# Patient Record
Sex: Male | Born: 1945 | Race: White | Hispanic: No | Marital: Married | Smoking: Former smoker
Health system: Southern US, Community
[De-identification: ages and names within clinical notes are randomized; demographics above are authoritative.]

## PROBLEM LIST (undated history)

## (undated) DIAGNOSIS — F419 Anxiety disorder, unspecified: Secondary | ICD-10-CM

## (undated) DIAGNOSIS — M1990 Unspecified osteoarthritis, unspecified site: Secondary | ICD-10-CM

## (undated) DIAGNOSIS — I10 Essential (primary) hypertension: Secondary | ICD-10-CM

## (undated) DIAGNOSIS — E7800 Pure hypercholesterolemia, unspecified: Secondary | ICD-10-CM

## (undated) DIAGNOSIS — J189 Pneumonia, unspecified organism: Secondary | ICD-10-CM

## (undated) HISTORY — PX: BUNIONECTOMY: SHX129

---

## 2015-05-04 ENCOUNTER — Encounter (HOSPITAL_COMMUNITY): Payer: Self-pay

## 2015-05-04 ENCOUNTER — Emergency Department (HOSPITAL_COMMUNITY)
Admission: EM | Admit: 2015-05-04 | Discharge: 2015-05-04 | Disposition: A | Discharge status: Home / Self Care | Payer: BLUE CROSS/BLUE SHIELD | Attending: Emergency Medicine | Admitting: Emergency Medicine

## 2015-05-04 DIAGNOSIS — I10 Essential (primary) hypertension: Secondary | ICD-10-CM | POA: Diagnosis not present

## 2015-05-04 DIAGNOSIS — Z87891 Personal history of nicotine dependence: Secondary | ICD-10-CM | POA: Insufficient documentation

## 2015-05-04 DIAGNOSIS — F419 Anxiety disorder, unspecified: Secondary | ICD-10-CM | POA: Diagnosis not present

## 2015-05-04 DIAGNOSIS — R42 Dizziness and giddiness: Secondary | ICD-10-CM | POA: Diagnosis present

## 2015-05-04 DIAGNOSIS — Z79899 Other long term (current) drug therapy: Secondary | ICD-10-CM | POA: Diagnosis not present

## 2015-05-04 DIAGNOSIS — E7800 Pure hypercholesterolemia, unspecified: Secondary | ICD-10-CM | POA: Diagnosis not present

## 2015-05-04 HISTORY — DX: Essential (primary) hypertension: I10

## 2015-05-04 HISTORY — DX: Anxiety disorder, unspecified: F41.9

## 2015-05-04 HISTORY — DX: Pure hypercholesterolemia, unspecified: E78.00

## 2015-05-04 LAB — URINALYSIS, ROUTINE W REFLEX MICROSCOPIC
BILIRUBIN URINE: NEGATIVE
GLUCOSE, UA: NEGATIVE mg/dL
Hgb urine dipstick: NEGATIVE
KETONES UR: NEGATIVE mg/dL
LEUKOCYTES UA: NEGATIVE
NITRITE: NEGATIVE
PH: 7 (ref 5.0–8.0)
PROTEIN: NEGATIVE mg/dL
Specific Gravity, Urine: 1.026 (ref 1.005–1.030)

## 2015-05-04 LAB — BASIC METABOLIC PANEL
Anion gap: 11 (ref 5–15)
BUN: 20 mg/dL (ref 6–20)
CO2: 23 mmol/L (ref 22–32)
CREATININE: 0.93 mg/dL (ref 0.61–1.24)
Calcium: 9.5 mg/dL (ref 8.9–10.3)
Chloride: 107 mmol/L (ref 101–111)
GFR calc Af Amer: >60 mL/min (ref >60)
GLUCOSE: 120 mg/dL — AB (ref 65–99)
Potassium: 4.3 mmol/L (ref 3.5–5.1)
SODIUM: 141 mmol/L (ref 135–145)

## 2015-05-04 LAB — CBC
HCT: 43.9 % (ref 39.0–52.0)
Hemoglobin: 15.1 g/dL (ref 13.0–17.0)
MCH: 32.7 pg (ref 26.0–34.0)
MCHC: 34.4 g/dL (ref 30.0–36.0)
MCV: 95 fL (ref 78.0–100.0)
PLATELETS: 168 10*3/uL (ref 150–400)
RBC: 4.62 MIL/uL (ref 4.22–5.81)
RDW: 12.2 % (ref 11.5–15.5)
WBC: 8 10*3/uL (ref 4.0–10.5)

## 2015-05-04 LAB — CBG MONITORING, ED: Glucose-Capillary: 122 mg/dL — ABNORMAL HIGH (ref 65–99)

## 2015-05-04 MED ORDER — DIAZEPAM 5 MG PO TABS: 5.0000 mg | ORAL_TABLET | Freq: Three times a day (TID) | ORAL | Status: AC | PRN

## 2015-05-04 MED ORDER — SODIUM CHLORIDE 0.9 % IV BOLUS (SEPSIS)
1000.0000 mL | Freq: Once | INTRAVENOUS | Status: AC
  Administered 2015-05-04: 1000 mL via INTRAVENOUS

## 2015-05-04 MED ORDER — MECLIZINE HCL 25 MG PO TABS: 25.0000 mg | ORAL_TABLET | Freq: Three times a day (TID) | ORAL | Status: AC | PRN

## 2015-05-04 MED ORDER — MECLIZINE HCL 25 MG PO TABS
25.0000 mg | ORAL_TABLET | Freq: Once | ORAL | Status: AC
  Administered 2015-05-04: 25 mg via ORAL
  Filled 2015-05-04: qty 1

## 2015-05-04 MED ORDER — ONDANSETRON HCL 4 MG/2ML IJ SOLN
4.0000 mg | Freq: Once | INTRAMUSCULAR | Status: AC
  Administered 2015-05-04: 4 mg via INTRAVENOUS
  Filled 2015-05-04: qty 2

## 2015-05-04 MED ORDER — LORAZEPAM 2 MG/ML IJ SOLN
1.0000 mg | Freq: Once | INTRAMUSCULAR | Status: AC
  Administered 2015-05-04: 1 mg via INTRAVENOUS
  Filled 2015-05-04: qty 1

## 2015-05-04 NOTE — ED Notes (Signed)
Per pt, was at work today.  Felt dizzy, nauseated and diaphoretic.  Sudden onset.  Has not stopped.  Denies chest pain.  No fever.  No vomiting.

## 2015-05-04 NOTE — ED Provider Notes (Signed)
CSN: [id number]     Arrival date & time 05/04/15  1115 History   First MD Initiated Contact with Patient 05/04/15 1136     Chief Complaint  Patient presents with  . Dizziness  . Nausea      HPI  Patient presents to the emergency department after complaints of acute sensation of movement and spinning.  He states his symptoms are worse with rapid movements of his head.  He denies weakness of his arms or legs.  He denies disequilibrium.  No prior history of vertigo.  Denies injury or trauma.  No headache.  No neck pain.  Denies weakness of his arms or legs.  He was at work when the symptoms began.  No chest pain shortness of breath.  Symptoms are moderate in severity.  Past Medical History  Diagnosis Date  . Hypertension   . Anxiety   . Hypercholesteremia    Past Surgical History  Procedure Laterality Date  . Bunionectomy     History reviewed. No pertinent family history. Social History  Substance Use Topics  . Smoking status: Former Games developer  . Smokeless tobacco: None  . Alcohol Use: Yes     Comment: social    Review of Systems  All other systems reviewed and are negative.     Allergies  Review of patient's allergies indicates no known allergies.  Home Medications   Prior to Admission medications   Medication Sig Start Date End Date Taking? Authorizing Provider  aspirin-acetaminophen-caffeine (EXCEDRIN MIGRAINE) [phone] MG tablet Take 1 tablet by mouth daily as needed for headache.   Yes Historical Provider, MD  chlorhexidine (PERIDEX) 0.12 % solution by Mouth Rinse route. Swish with 1/2 a capful before bedtime and spit starting 24 hours after surgery 04/15/15  Yes Historical Provider, MD  Glucos-Chondroit-Hyaluron-MSM (GLUCOSAMINE CHONDROITIN JOINT) TABS Take 2 tablets by mouth daily.   Yes Historical Provider, MD  ibuprofen (ADVIL,MOTRIN) 800 MG tablet Take 800 mg by mouth every 6 (six) hours as needed for moderate pain.  04/15/15  Yes Historical Provider, MD  losartan  (COZAAR) 50 MG tablet Take 50 mg by mouth daily. 04/11/15  Yes Historical Provider, MD  naproxen sodium (ANAPROX) 220 MG tablet Take 220 mg by mouth daily as needed (pain).   Yes Historical Provider, MD  Omega-3 Fatty Acids (OMEGA 3 500) 500 MG CAPS Take 1,000 mg by mouth daily.   Yes Historical Provider, MD  PARoxetine (PAXIL) 20 MG tablet Take 20 mg by mouth daily. 04/11/15  Yes Historical Provider, MD  simvastatin (ZOCOR) 10 MG tablet Take 10 mg by mouth daily. 04/11/15  Yes Historical Provider, MD                 BP 124/80 mmHg  Pulse 78  Resp 18  SpO2 95% Physical Exam  Constitutional: He is oriented to person, place, and time. He appears well-developed and well-nourished.  HENT:  Head: Normocephalic and atraumatic.  Eyes: EOM are normal. Pupils are equal, round, and reactive to light.  Neck: Normal range of motion.  Cardiovascular: Normal rate, regular rhythm, normal heart sounds and intact distal pulses.   Pulmonary/Chest: Effort normal and breath sounds normal. No respiratory distress.  Abdominal: Soft. He exhibits no distension. There is no tenderness.  Musculoskeletal: Normal range of motion.  Neurological: He is alert and oriented to person, place, and time.  5/5 strength in major muscle groups of  bilateral upper and lower extremities. Speech normal. No facial asymetry.   Skin: Skin is warm  and dry.  Psychiatric: He has a normal mood and affect. Judgment normal.  Nursing note and vitals reviewed.   ED Course  Procedures (including critical care time) Labs Review Labs Reviewed  BASIC METABOLIC PANEL - Abnormal; Notable for the following:    Glucose, Bld 120 (*)    All other components within normal limits  CBG MONITORING, ED - Abnormal; Notable for the following:    Glucose-Capillary 122 (*)    All other components within normal limits  CBC  URINALYSIS, ROUTINE W REFLEX MICROSCOPIC (NOT AT [facility])    Imaging Review No results found. I have personally reviewed and  evaluated these images and lab results as part of my medical decision-making.   EKG Interpretation   Date/Time:  Tuesday May 04 2015 11:26:30 EST Ventricular Rate:  76 PR Interval:  153 QRS Duration: 93 QT Interval:  390 QTC Calculation: 438 R Axis:   64 Text Interpretation:  Sinus rhythm No old tracing to compare Confirmed by  Augusta Hilbert  MD, Caryn Bee ([id number]) on 05/04/2015 11:36:58 AM      MDM   Final diagnoses:  Vertigo    Patient feels much better after Ativan and Valium in the emergency department.  He is able to stand and sit without any difficulty.  He is able to rapidly move his head.  His neurologic exam remains normal.  Doubt central vertigo.  Discharge home with both Valium and meclizine.    Azalia Bilis, MD 05/04/15 [phone]

## 2015-05-04 NOTE — ED Notes (Signed)
Patient aware we need urine, I set a urinal at bedside, patient will let us know when he has a specimen ready.

## 2015-05-04 NOTE — Discharge Instructions (Signed)
Benign Positional Vertigo Vertigo is the feeling that you or your surroundings are moving when they are not. Benign positional vertigo is the most common form of vertigo. The cause of this condition is not serious (is benign). This condition is triggered by certain movements and positions (is positional). This condition can be dangerous if it occurs while you are doing something that could endanger you or others, such as driving.  CAUSES In many cases, the cause of this condition is not known. It may be caused by a disturbance in an area of the inner ear that helps your brain to sense movement and balance. This disturbance can be caused by a viral infection (labyrinthitis), head injury, or repetitive motion. RISK FACTORS This condition is more likely to develop in:  Women.  People who are 50 years of age or older. SYMPTOMS Symptoms of this condition usually happen when you move your head or your eyes in different directions. Symptoms may start suddenly, and they usually last for less than a minute. Symptoms may include:  Loss of balance and falling.  Feeling like you are spinning or moving.  Feeling like your surroundings are spinning or moving.  Nausea and vomiting.  Blurred vision.  Dizziness.  Involuntary eye movement (nystagmus). Symptoms can be mild and cause only slight annoyance, or they can be severe and interfere with daily life. Episodes of benign positional vertigo may return (recur) over time, and they may be triggered by certain movements. Symptoms may improve over time. DIAGNOSIS This condition is usually diagnosed by medical history and a physical exam of the head, neck, and ears. You may be referred to a health care provider who specializes in ear, nose, and throat (ENT) problems (otolaryngologist) or a provider who specializes in disorders of the nervous system (neurologist). You may have additional testing, including:  MRI.  A CT scan.  Eye movement tests. Your  health care provider may ask you to change positions quickly while he or she watches you for symptoms of benign positional vertigo, such as nystagmus. Eye movement may be tested with an electronystagmogram (ENG), caloric stimulation, the Dix-Hallpike test, or the roll test.  An electroencephalogram (EEG). This records electrical activity in your brain.  Hearing tests. TREATMENT Usually, your health care provider will treat this by moving your head in specific positions to adjust your inner ear back to normal. Surgery may be needed in severe cases, but this is rare. In some cases, benign positional vertigo may resolve on its own in 2-4 weeks. HOME CARE INSTRUCTIONS Safety  Move slowly.Avoid sudden body or head movements.  Avoid driving.  Avoid operating heavy machinery.  Avoid doing any tasks that would be dangerous to you or others if a vertigo episode would occur.  If you have trouble walking or keeping your balance, try using a cane for stability. If you feel dizzy or unstable, sit down right away.  Return to your normal activities as told by your health care provider. Ask your health care provider what activities are safe for you. General Instructions  Take over-the-counter and prescription medicines only as told by your health care provider.  Avoid certain positions or movements as told by your health care provider.  Drink enough fluid to keep your urine clear or pale yellow.  Keep all follow-up visits as told by your health care provider. This is important. SEEK MEDICAL CARE IF:  You have a fever.  Your condition gets worse or you develop new symptoms.  Your family or friends   notice any behavioral changes.  Your nausea or vomiting gets worse.  You have numbness or a "pins and needles" sensation. SEEK IMMEDIATE MEDICAL CARE IF:  You have difficulty speaking or moving.  You are always dizzy.  You faint.  You develop severe headaches.  You have weakness in your  legs or arms.  You have changes in your hearing or vision.  You develop a stiff neck.  You develop sensitivity to light.   This information is not intended to replace advice given to you by your health care provider. Make sure you discuss any questions you have with your health care provider.   Document Released: 12/19/2005 Document Revised: 12/02/2014 Document Reviewed: 07/06/2014 Elsevier Interactive Patient Education 2016 [employer]  

## 2018-07-05 ENCOUNTER — Emergency Department (HOSPITAL_BASED_OUTPATIENT_CLINIC_OR_DEPARTMENT_OTHER)
Admission: EM | Admit: 2018-07-05 | Discharge: 2018-07-06 | Disposition: A | Discharge status: Home / Self Care | Payer: 59 | Attending: Emergency Medicine | Admitting: Emergency Medicine

## 2018-07-05 ENCOUNTER — Emergency Department (HOSPITAL_BASED_OUTPATIENT_CLINIC_OR_DEPARTMENT_OTHER): Payer: 59

## 2018-07-05 ENCOUNTER — Other Ambulatory Visit: Payer: Self-pay

## 2018-07-05 ENCOUNTER — Encounter (HOSPITAL_BASED_OUTPATIENT_CLINIC_OR_DEPARTMENT_OTHER): Payer: Self-pay | Admitting: *Deleted

## 2018-07-05 DIAGNOSIS — J189 Pneumonia, unspecified organism: Secondary | ICD-10-CM | POA: Insufficient documentation

## 2018-07-05 DIAGNOSIS — Z79899 Other long term (current) drug therapy: Secondary | ICD-10-CM | POA: Diagnosis not present

## 2018-07-05 DIAGNOSIS — I10 Essential (primary) hypertension: Secondary | ICD-10-CM | POA: Diagnosis not present

## 2018-07-05 DIAGNOSIS — J181 Lobar pneumonia, unspecified organism: Secondary | ICD-10-CM

## 2018-07-05 DIAGNOSIS — R509 Fever, unspecified: Secondary | ICD-10-CM | POA: Diagnosis present

## 2018-07-05 DIAGNOSIS — Z87891 Personal history of nicotine dependence: Secondary | ICD-10-CM | POA: Diagnosis not present

## 2018-07-05 LAB — CBC WITH DIFFERENTIAL/PLATELET
Abs Immature Granulocytes: 0.01 10*3/uL (ref 0.00–0.07)
Basophils Absolute: 0 10*3/uL (ref 0.0–0.1)
Basophils Relative: 0 %
Eosinophils Absolute: 0 10*3/uL (ref 0.0–0.5)
Eosinophils Relative: 0 %
HCT: 40.8 % (ref 39.0–52.0)
Hemoglobin: 13.7 g/dL (ref 13.0–17.0)
Immature Granulocytes: 0 %
Lymphocytes Relative: 17 %
Lymphs Abs: 0.7 10*3/uL (ref 0.7–4.0)
MCH: 32 pg (ref 26.0–34.0)
MCHC: 33.6 g/dL (ref 30.0–36.0)
MCV: 95.3 fL (ref 80.0–100.0)
Monocytes Absolute: 0.3 10*3/uL (ref 0.1–1.0)
Monocytes Relative: 9 %
Neutro Abs: 2.9 10*3/uL (ref 1.7–7.7)
Neutrophils Relative %: 74 %
Platelets: 150 10*3/uL (ref 150–400)
RBC: 4.28 MIL/uL (ref 4.22–5.81)
RDW: 12 % (ref 11.5–15.5)
WBC: 3.9 10*3/uL — ABNORMAL LOW (ref 4.0–10.5)
nRBC: 0 % (ref 0.0–0.2)

## 2018-07-05 LAB — URINALYSIS, ROUTINE W REFLEX MICROSCOPIC
Bilirubin Urine: NEGATIVE
Glucose, UA: NEGATIVE mg/dL
Hgb urine dipstick: NEGATIVE
Ketones, ur: NEGATIVE mg/dL
Leukocytes,Ua: NEGATIVE
Nitrite: NEGATIVE
Protein, ur: NEGATIVE mg/dL
Specific Gravity, Urine: 1.025 (ref 1.005–1.030)
pH: 6 (ref 5.0–8.0)

## 2018-07-05 LAB — COMPREHENSIVE METABOLIC PANEL
ALT: 29 U/L (ref 0–44)
AST: 30 U/L (ref 15–41)
Albumin: 3.8 g/dL (ref 3.5–5.0)
Alkaline Phosphatase: 47 U/L (ref 38–126)
Anion gap: 6 (ref 5–15)
BUN: 15 mg/dL (ref 8–23)
CO2: 21 mmol/L — ABNORMAL LOW (ref 22–32)
Calcium: 8.8 mg/dL — ABNORMAL LOW (ref 8.9–10.3)
Chloride: 102 mmol/L (ref 98–111)
Creatinine, Ser: 0.95 mg/dL (ref 0.61–1.24)
GFR calc Af Amer: >60 mL/min (ref >60)
GFR calc non Af Amer: >60 mL/min (ref >60)
Glucose, Bld: 117 mg/dL — ABNORMAL HIGH (ref 70–99)
Potassium: 3.5 mmol/L (ref 3.5–5.1)
Sodium: 129 mmol/L — ABNORMAL LOW (ref 135–145)
Total Bilirubin: 0.6 mg/dL (ref 0.3–1.2)
Total Protein: 6.7 g/dL (ref 6.5–8.1)

## 2018-07-05 LAB — LACTIC ACID, PLASMA: Lactic Acid, Venous: 0.8 mmol/L (ref 0.5–1.9)

## 2018-07-05 MED ORDER — SODIUM CHLORIDE 0.9 % IV SOLN
1.0000 g | Freq: Once | INTRAVENOUS | Status: AC
  Administered 2018-07-05: 1 g via INTRAVENOUS
  Filled 2018-07-05: qty 10

## 2018-07-05 MED ORDER — SODIUM CHLORIDE 0.9 % IV SOLN
1000.0000 mL | INTRAVENOUS | Status: DC
  Administered 2018-07-05: 1000 mL via INTRAVENOUS

## 2018-07-05 MED ORDER — AZITHROMYCIN 250 MG PO TABS
500.0000 mg | ORAL_TABLET | Freq: Once | ORAL | Status: AC
  Administered 2018-07-05: 500 mg via ORAL
  Filled 2018-07-05: qty 2

## 2018-07-05 NOTE — ED Triage Notes (Addendum)
Pt c/o fever 102.3 at home  and and weakness x 2 days

## 2018-07-05 NOTE — ED Provider Notes (Signed)
[facility] EMERGENCY DEPARTMENT Provider Note   CSN: [id number] Arrival date & time: 07/05/18  2120    History   Chief Complaint Chief Complaint  Patient presents with  . Fever    HPI Brent Ho is a [age] y.o. male.     73 year old reports intermittent fever x 2 days. Patient is currently wearing an isolation mask. Not currently febrile. Not coughing. Episode of diarrhea today. Highest fever 102.3. Fever responds to ibuprofen. He is in contact with the public at work, but company is utilizing social distancing and increased cleaning of exposed surfaces. No known sick exposures.  The history is provided by the patient. No language interpreter was used.  Fever  Max temp prior to arrival:  102.3 Temp source:  Oral Severity:  Moderate Onset quality:  Sudden Duration:  2 days Timing:  Intermittent Progression:  Waxing and waning Chronicity:  New Relieved by:  Ibuprofen Worsened by:  Nothing Associated symptoms: diarrhea   Associated symptoms: no chest pain, no chills, no confusion, no congestion, no cough, no dysuria, no headaches, no myalgias, no nausea, no rhinorrhea, no sore throat and no vomiting   Risk factors: no immunosuppression     Past Medical History:  Diagnosis Date  . Anxiety   . Hypercholesteremia   . Hypertension     There are no active problems to display for this patient.   Past Surgical History:  Procedure Laterality Date  . BUNIONECTOMY          Home Medications    Prior to Admission medications   Medication Sig Start Date End Date Taking? Authorizing Provider  ibuprofen (ADVIL,MOTRIN) 800 MG tablet Take 400 mg by mouth every 6 (six) hours as needed for moderate pain.  04/15/15  Yes [provider]  aspirin-acetaminophen-caffeine (EXCEDRIN MIGRAINE) [phone] MG tablet Take 1 tablet by mouth daily as needed for headache.    [provider]  chlorhexidine (PERIDEX) 0.12 % solution by Mouth Rinse route. Swish with  1/2 a capful before bedtime and spit starting 24 hours after surgery 04/15/15   [provider]  diazepam (VALIUM) 5 MG tablet Take 1 tablet (5 mg total) by mouth every 8 (eight) hours as needed (dizziness). 05/04/15   Azalia Bilisampos, Kevin, MD  Glucos-Chondroit-Hyaluron-MSM (GLUCOSAMINE CHONDROITIN JOINT) TABS Take 2 tablets by mouth daily.    [provider]  losartan (COZAAR) 50 MG tablet Take 50 mg by mouth daily. 04/11/15   [provider]  meclizine (ANTIVERT) 25 MG tablet Take 1 tablet (25 mg total) by mouth 3 (three) times daily as needed for dizziness. 05/04/15   Azalia Bilisampos, Kevin, MD  naproxen sodium (ANAPROX) 220 MG tablet Take 220 mg by mouth daily as needed (pain).    [provider]  Omega-3 Fatty Acids (OMEGA 3 500) 500 MG CAPS Take 1,000 mg by mouth daily.    [provider]  PARoxetine (PAXIL) 20 MG tablet Take 20 mg by mouth daily. 04/11/15   [provider]  simvastatin (ZOCOR) 10 MG tablet Take 10 mg by mouth daily. 04/11/15   [provider]    Family History History reviewed. No pertinent family history.  Social History Social History   Tobacco Use  . Smoking status: Former Games developermoker  . Smokeless tobacco: Never Used  Substance Use Topics  . Alcohol use: Yes    Comment: social  . Drug use: No     Allergies   Patient has no known allergies.   Review of Systems  Review of Systems  Constitutional: Positive for fatigue and fever. Negative for chills.  HENT: Negative for congestion, rhinorrhea and sore throat.   Respiratory: Negative for cough and shortness of breath.   Cardiovascular: Negative for chest pain.  Gastrointestinal: Positive for diarrhea. Negative for nausea and vomiting.  Genitourinary: Negative for dysuria.  Musculoskeletal: Negative for myalgias.  Neurological: Negative for headaches.  Psychiatric/Behavioral: Negative for confusion.  All other systems reviewed and are negative.    Physical Exam  Updated Vital Signs BP (!) 162/81 (BP Location: Right Arm)   Pulse 92   Temp 98.4 F (36.9 C)   Resp 18   Ht 5' 7.5" (1.715 m)   Wt 95.3 kg   SpO2 96%   BMI 32.41 kg/m   Physical Exam Vitals signs and nursing note reviewed.  Constitutional:      Appearance: Normal appearance. He is not ill-appearing.  Eyes:     Conjunctiva/sclera: Conjunctivae normal.  Cardiovascular:     Rate and Rhythm: Normal rate.  Pulmonary:     Effort: Pulmonary effort is normal.     Breath sounds: Normal breath sounds.  Abdominal:     General: Bowel sounds are normal. There is no distension.     Palpations: Abdomen is soft.     Tenderness: There is no abdominal tenderness.  Musculoskeletal:     Right lower leg: No edema.     Left lower leg: No edema.  Skin:    General: Skin is warm and dry.  Neurological:     Mental Status: He is alert and oriented to person, place, and time.  Psychiatric:        Mood and Affect: Mood normal.      ED Treatments / Results  Labs (all labs ordered are listed, but only abnormal results are displayed) Labs Reviewed  COMPREHENSIVE METABOLIC PANEL - Abnormal; Notable for the following components:      Result Value   Sodium 129 (*)    CO2 21 (*)    Glucose, Bld 117 (*)    Calcium 8.8 (*)    All other components within normal limits  CBC WITH DIFFERENTIAL/PLATELET - Abnormal; Notable for the following components:   WBC 3.9 (*)    All other components within normal limits  URINALYSIS, ROUTINE W REFLEX MICROSCOPIC  LACTIC ACID, PLASMA    EKG None  Radiology No results found.  Procedures Procedures (including critical care time)  Medications Ordered in ED Medications  0.9 %  sodium chloride infusion (has no administration in time range)     Initial Impression / Assessment and Plan / ED Course  I have reviewed the triage vital signs and the nursing notes.  Pertinent labs & imaging results that were available during my care of the patient were  reviewed by me and considered in my medical decision making (see chart for details).    Patient discussed with Dr. Judd Lien.   Patient has been diagnosed with CAP via chest xray. Pt is not ill appearing, not immunocompromised, and does not have multiple co morbidities, therefore I feel like the they can be treated as an OP with abx therapy. Patient given dose of rocephin and azithromycin in ED, with prescription for vantin and completion of azithromycin dosing. Pt has been advised to return to the ED if symptoms worsen or they do not improve. Pt verbalizes understanding and is agreeable with plan. Patient also provided with home instructions for potential COVID-19 exposure.     Final Clinical Impressions(s) /  ED Diagnoses   Final diagnoses:  Community acquired pneumonia of left lower lobe of lung [facility])    ED Discharge Orders         Ordered    cefpodoxime (VANTIN) 200 MG tablet  2 times daily     07/06/18 0014    azithromycin (ZITHROMAX) 250 MG tablet  Daily     07/06/18 0014           Felicie Morn, NP 07/06/18 [id number]    Geoffery Lyons, MD 07/06/18 1521

## 2018-07-05 NOTE — ED Notes (Signed)
Per triage nurse, pt has a temp of 102 at home and took ibuprofen prior to arrival. 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

## 2018-07-05 NOTE — ED Notes (Signed)
Pt c/o generalized malaise for a week with a fever that started two days ago. 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 He denies chest pain, denies SOB, denies sore throat, denies headache, denies cough as well.

## 2018-07-05 NOTE — ED Notes (Signed)
ED Provider at bedside. 

## 2018-07-06 MED ORDER — CEFPODOXIME PROXETIL 200 MG PO TABS: 200.0000 mg | ORAL_TABLET | Freq: Two times a day (BID) | ORAL | 0 refills | Status: DC

## 2018-07-06 MED ORDER — AZITHROMYCIN 250 MG PO TABS: 250.0000 mg | ORAL_TABLET | Freq: Every day | ORAL | 0 refills | Status: DC

## 2018-07-06 NOTE — Discharge Instructions (Addendum)
There is a possibility that your symptoms are related to COVID-19. You will need to self quarantine at home. Refer to the attached discharge instructions. Please advise your workplace of your contact with co-workers while having a fever. Your close contacts at work should self monitor for onset of fever or other respiratory symptoms. Do not return to work until you are fever free for 72 hours without taking fever reducing medication. Take antibiotics as directed. Monitor for worsening symptoms, including fever not responsive to fever reducing medicine and onset of shortness of breath. If your symptoms worsen, [facility] would be the appropriate [facility] facility. Follow-up with your primary care provider.

## 2018-07-06 NOTE — ED Notes (Signed)
Pt informed by Felicie Morn, NP and nurse that the causes for his symptoms could be a pneumonia, but could also be COVID. He is given strict quarantine instructions and monitoring instructions in verbal and written form.

## 2018-07-06 NOTE — ED Notes (Signed)
Pt verbalizes understanding of self-quarantine and that his wife needs to do so now as well. Pt denies any further needs at this time

## 2018-07-12 ENCOUNTER — Other Ambulatory Visit: Payer: Self-pay

## 2018-07-12 ENCOUNTER — Encounter (HOSPITAL_COMMUNITY): Payer: Self-pay | Admitting: Emergency Medicine

## 2018-07-12 ENCOUNTER — Inpatient Hospital Stay (HOSPITAL_COMMUNITY)
Admission: EM | Admit: 2018-07-12 | Discharge: 2018-07-19 | DRG: 871 | Disposition: A | Discharge status: Home Health | Payer: 59 | Attending: Internal Medicine | Admitting: Internal Medicine

## 2018-07-12 ENCOUNTER — Emergency Department (HOSPITAL_COMMUNITY): Payer: 59

## 2018-07-12 DIAGNOSIS — E785 Hyperlipidemia, unspecified: Secondary | ICD-10-CM | POA: Diagnosis present

## 2018-07-12 DIAGNOSIS — Z6835 Body mass index (BMI) 35.0-35.9, adult: Secondary | ICD-10-CM

## 2018-07-12 DIAGNOSIS — E7800 Pure hypercholesterolemia, unspecified: Secondary | ICD-10-CM | POA: Diagnosis present

## 2018-07-12 DIAGNOSIS — F419 Anxiety disorder, unspecified: Secondary | ICD-10-CM | POA: Diagnosis present

## 2018-07-12 DIAGNOSIS — F329 Major depressive disorder, single episode, unspecified: Secondary | ICD-10-CM | POA: Diagnosis present

## 2018-07-12 DIAGNOSIS — E669 Obesity, unspecified: Secondary | ICD-10-CM | POA: Diagnosis present

## 2018-07-12 DIAGNOSIS — J069 Acute upper respiratory infection, unspecified: Secondary | ICD-10-CM | POA: Insufficient documentation

## 2018-07-12 DIAGNOSIS — D72810 Lymphocytopenia: Secondary | ICD-10-CM | POA: Diagnosis present

## 2018-07-12 DIAGNOSIS — J9601 Acute respiratory failure with hypoxia: Secondary | ICD-10-CM | POA: Diagnosis present

## 2018-07-12 DIAGNOSIS — A419 Sepsis, unspecified organism: Secondary | ICD-10-CM | POA: Diagnosis present

## 2018-07-12 DIAGNOSIS — Z79899 Other long term (current) drug therapy: Secondary | ICD-10-CM | POA: Diagnosis not present

## 2018-07-12 DIAGNOSIS — Z87891 Personal history of nicotine dependence: Secondary | ICD-10-CM

## 2018-07-12 DIAGNOSIS — R0600 Dyspnea, unspecified: Secondary | ICD-10-CM

## 2018-07-12 DIAGNOSIS — I10 Essential (primary) hypertension: Secondary | ICD-10-CM | POA: Diagnosis present

## 2018-07-12 DIAGNOSIS — F32A Depression, unspecified: Secondary | ICD-10-CM | POA: Diagnosis present

## 2018-07-12 DIAGNOSIS — J1289 Other viral pneumonia: Secondary | ICD-10-CM | POA: Diagnosis present

## 2018-07-12 DIAGNOSIS — A4189 Other specified sepsis: Principal | ICD-10-CM | POA: Diagnosis present

## 2018-07-12 LAB — FIBRINOGEN: Fibrinogen: 722 mg/dL — ABNORMAL HIGH (ref 210–475)

## 2018-07-12 LAB — COMPREHENSIVE METABOLIC PANEL
ALT: 38 U/L (ref 0–44)
AST: 57 U/L — ABNORMAL HIGH (ref 15–41)
Albumin: 3.3 g/dL — ABNORMAL LOW (ref 3.5–5.0)
Alkaline Phosphatase: 35 U/L — ABNORMAL LOW (ref 38–126)
Anion gap: 10 (ref 5–15)
BUN: 24 mg/dL — ABNORMAL HIGH (ref 8–23)
CO2: 23 mmol/L (ref 22–32)
Calcium: 8.6 mg/dL — ABNORMAL LOW (ref 8.9–10.3)
Chloride: 100 mmol/L (ref 98–111)
Creatinine, Ser: 0.87 mg/dL (ref 0.61–1.24)
GFR calc Af Amer: >60 mL/min (ref >60)
GFR calc non Af Amer: >60 mL/min (ref >60)
Glucose, Bld: 122 mg/dL — ABNORMAL HIGH (ref 70–99)
Potassium: 4 mmol/L (ref 3.5–5.1)
Sodium: 133 mmol/L — ABNORMAL LOW (ref 135–145)
Total Bilirubin: 1.1 mg/dL (ref 0.3–1.2)
Total Protein: 7.2 g/dL (ref 6.5–8.1)

## 2018-07-12 LAB — CBC WITH DIFFERENTIAL/PLATELET
Abs Immature Granulocytes: 0.12 10*3/uL — ABNORMAL HIGH (ref 0.00–0.07)
Basophils Absolute: 0 10*3/uL (ref 0.0–0.1)
Basophils Relative: 0 %
Eosinophils Absolute: 0 10*3/uL (ref 0.0–0.5)
Eosinophils Relative: 0 %
HCT: 42.1 % (ref 39.0–52.0)
Hemoglobin: 14.4 g/dL (ref 13.0–17.0)
Immature Granulocytes: 1 %
Lymphocytes Relative: 6 %
Lymphs Abs: 0.5 10*3/uL — ABNORMAL LOW (ref 0.7–4.0)
MCH: 31.9 pg (ref 26.0–34.0)
MCHC: 34.2 g/dL (ref 30.0–36.0)
MCV: 93.3 fL (ref 80.0–100.0)
Monocytes Absolute: 0.3 10*3/uL (ref 0.1–1.0)
Monocytes Relative: 3 %
Neutro Abs: 8.5 10*3/uL — ABNORMAL HIGH (ref 1.7–7.7)
Neutrophils Relative %: 90 %
Platelets: 221 10*3/uL (ref 150–400)
RBC: 4.51 MIL/uL (ref 4.22–5.81)
RDW: 12.1 % (ref 11.5–15.5)
WBC: 9.4 10*3/uL (ref 4.0–10.5)
nRBC: 0 % (ref 0.0–0.2)

## 2018-07-12 LAB — LACTIC ACID, PLASMA: Lactic Acid, Venous: 1.5 mmol/L (ref 0.5–1.9)

## 2018-07-12 LAB — LACTATE DEHYDROGENASE: LDH: 436 U/L — ABNORMAL HIGH (ref 98–192)

## 2018-07-12 LAB — SARS CORONAVIRUS 2 BY RT PCR (HOSPITAL ORDER, PERFORMED IN ~~LOC~~ HOSPITAL LAB): SARS Coronavirus 2: POSITIVE — AB

## 2018-07-12 LAB — TRIGLYCERIDES: Triglycerides: 102 mg/dL (ref <150)

## 2018-07-12 LAB — D-DIMER, QUANTITATIVE (NOT AT ARMC): D-Dimer, Quant: 2.66 ug/mL-FEU — ABNORMAL HIGH (ref 0.00–0.50)

## 2018-07-12 LAB — PROCALCITONIN: Procalcitonin: <0.1 ng/mL

## 2018-07-12 LAB — C-REACTIVE PROTEIN: CRP: 3.8 mg/dL — ABNORMAL HIGH (ref <1.0)

## 2018-07-12 LAB — FERRITIN: Ferritin: 1247 ng/mL — ABNORMAL HIGH (ref 24–336)

## 2018-07-12 MED ORDER — SENNOSIDES-DOCUSATE SODIUM 8.6-50 MG PO TABS
1.0000 | ORAL_TABLET | Freq: Every evening | ORAL | Status: DC | PRN
  Administered 2018-07-13: 1 via ORAL
  Filled 2018-07-12: qty 1

## 2018-07-12 MED ORDER — ALBUTEROL SULFATE HFA 108 (90 BASE) MCG/ACT IN AERS
2.0000 | INHALATION_SPRAY | Freq: Three times a day (TID) | RESPIRATORY_TRACT | Status: DC
  Administered 2018-07-13 – 2018-07-14 (×5): 2 via RESPIRATORY_TRACT
  Filled 2018-07-12: qty 6.7

## 2018-07-12 MED ORDER — HYDROXYCHLOROQUINE SULFATE 200 MG PO TABS
400.0000 mg | ORAL_TABLET | Freq: Two times a day (BID) | ORAL | Status: AC
  Administered 2018-07-13 (×2): 400 mg via ORAL
  Filled 2018-07-12 (×2): qty 2

## 2018-07-12 MED ORDER — HYDRALAZINE HCL 20 MG/ML IJ SOLN: 5.0000 mg | INTRAMUSCULAR | Status: DC | PRN

## 2018-07-12 MED ORDER — IPRATROPIUM BROMIDE HFA 17 MCG/ACT IN AERS: 2.0000 | INHALATION_SPRAY | RESPIRATORY_TRACT | Status: DC

## 2018-07-12 MED ORDER — LEVALBUTEROL TARTRATE 45 MCG/ACT IN AERO: 2.0000 | INHALATION_SPRAY | Freq: Four times a day (QID) | RESPIRATORY_TRACT | Status: DC | PRN

## 2018-07-12 MED ORDER — ENOXAPARIN SODIUM 40 MG/0.4ML ~~LOC~~ SOLN
40.0000 mg | Freq: Every day | SUBCUTANEOUS | Status: DC
  Administered 2018-07-13 – 2018-07-19 (×7): 40 mg via SUBCUTANEOUS
  Filled 2018-07-12 (×7): qty 0.4

## 2018-07-12 MED ORDER — ONDANSETRON HCL 4 MG/2ML IJ SOLN: 4.0000 mg | Freq: Four times a day (QID) | INTRAMUSCULAR | Status: DC | PRN

## 2018-07-12 MED ORDER — ACETAMINOPHEN 325 MG PO TABS
650.0000 mg | ORAL_TABLET | Freq: Four times a day (QID) | ORAL | Status: DC | PRN
  Administered 2018-07-13 – 2018-07-16 (×5): 650 mg via ORAL
  Filled 2018-07-12 (×5): qty 2

## 2018-07-12 MED ORDER — ONDANSETRON HCL 4 MG PO TABS: 4.0000 mg | ORAL_TABLET | Freq: Four times a day (QID) | ORAL | Status: DC | PRN

## 2018-07-12 MED ORDER — ALBUTEROL SULFATE HFA 108 (90 BASE) MCG/ACT IN AERS: 2.0000 | INHALATION_SPRAY | RESPIRATORY_TRACT | Status: DC | PRN

## 2018-07-12 MED ORDER — SODIUM CHLORIDE 0.9 % IV SOLN
INTRAVENOUS | Status: DC
  Administered 2018-07-13 – 2018-07-14 (×4): via INTRAVENOUS

## 2018-07-12 MED ORDER — SIMVASTATIN 10 MG PO TABS
10.0000 mg | ORAL_TABLET | Freq: Every day | ORAL | Status: DC
  Administered 2018-07-14 – 2018-07-19 (×6): 10 mg via ORAL
  Filled 2018-07-12 (×7): qty 1

## 2018-07-12 MED ORDER — DM-GUAIFENESIN ER 30-600 MG PO TB12
1.0000 | ORAL_TABLET | Freq: Two times a day (BID) | ORAL | Status: DC | PRN
  Administered 2018-07-13 – 2018-07-16 (×4): 1 via ORAL
  Filled 2018-07-12 (×5): qty 1

## 2018-07-12 MED ORDER — HYDROXYCHLOROQUINE SULFATE 200 MG PO TABS
200.0000 mg | ORAL_TABLET | Freq: Two times a day (BID) | ORAL | Status: AC
  Administered 2018-07-13 – 2018-07-17 (×8): 200 mg via ORAL
  Filled 2018-07-12 (×8): qty 1

## 2018-07-12 MED ORDER — PAROXETINE HCL 20 MG PO TABS
20.0000 mg | ORAL_TABLET | Freq: Every day | ORAL | Status: DC
  Administered 2018-07-13 – 2018-07-19 (×7): 20 mg via ORAL
  Filled 2018-07-12 (×8): qty 1

## 2018-07-12 NOTE — ED Provider Notes (Signed)
[facility]-EMERGENCY DEPT Provider Note   CSN: [id number] Arrival date & time: 07/12/18  1804    History   Chief Complaint Chief Complaint  Patient presents with  . Fever  . Shortness of Breath  . Cough    HPI Brent Ho is a 73 y.o. male.     HPI  73 yo male presents with cough, fever, and dyspnea.  Patient began with fever 8 days ago.  He was seen at [facility] 1 week ago today.  He was placed on Zithromax and Vantin.  He has completed the Zithromax but states he was unable to complete the Vantin.  He is continued to have fever and cough.  He has had some diarrhea.  Today and yesterday he has becoming more short of breath.  He presents today due to dyspnea.  He denies any known covert contacts.  He has been at home with his wife.  Past Medical History:  Diagnosis Date  . Anxiety   . Hypercholesteremia   . Hypertension     There are no active problems to display for this patient.   Past Surgical History:  Procedure Laterality Date  . BUNIONECTOMY          Home Medications    Prior to Admission medications   Medication Sig Start Date End Date Taking? Authorizing Provider  aspirin-acetaminophen-caffeine (EXCEDRIN MIGRAINE) [phone] MG tablet Take 1 tablet by mouth daily as needed for headache.    [provider]  azithromycin (ZITHROMAX) 250 MG tablet Take 1 tablet (250 mg total) by mouth daily. Take one tablet daily for 4 days 07/06/18   Felicie Morn, NP  cefpodoxime (VANTIN) 200 MG tablet Take 1 tablet (200 mg total) by mouth 2 (two) times daily. 07/06/18   Felicie Morn, NP  chlorhexidine (PERIDEX) 0.12 % solution by Mouth Rinse route. Swish with 1/2 a capful before bedtime and spit starting 24 hours after surgery 04/15/15   [provider]  diazepam (VALIUM) 5 MG tablet Take 1 tablet (5 mg total) by mouth every 8 (eight) hours as needed (dizziness). 05/04/15   Azalia Bilis, MD  Glucos-Chondroit-Hyaluron-MSM (GLUCOSAMINE CHONDROITIN  JOINT) TABS Take 2 tablets by mouth daily.    [provider]  ibuprofen (ADVIL,MOTRIN) 800 MG tablet Take 400 mg by mouth every 6 (six) hours as needed for moderate pain.  04/15/15   [provider]  losartan (COZAAR) 50 MG tablet Take 50 mg by mouth daily. 04/11/15   [provider]  meclizine (ANTIVERT) 25 MG tablet Take 1 tablet (25 mg total) by mouth 3 (three) times daily as needed for dizziness. 05/04/15   Azalia Bilis, MD  naproxen sodium (ANAPROX) 220 MG tablet Take 220 mg by mouth daily as needed (pain).    [provider]  Omega-3 Fatty Acids (OMEGA 3 500) 500 MG CAPS Take 1,000 mg by mouth daily.    [provider]  PARoxetine (PAXIL) 20 MG tablet Take 20 mg by mouth daily. 04/11/15   [provider]  simvastatin (ZOCOR) 10 MG tablet Take 10 mg by mouth daily. 04/11/15   [provider]    Family History No family history on file.  Social History Social History   Tobacco Use  . Smoking status: Former Games developer  . Smokeless tobacco: Never Used  Substance Use Topics  . Alcohol use: Yes    Comment: social  . Drug use: No     Allergies   Patient has no known allergies.  Review of Systems Review of Systems  All other systems reviewed and are negative.    Physical Exam Updated Vital Signs BP (!) 142/83 (BP Location: Right Arm)   Pulse 91   Temp (!) 101.3 F (38.5 C) (Oral)   Resp 20   SpO2 (!) 87% Comment: pt placed on 2l nasal cannula, pt's o2 increased to 96%  Physical Exam Vitals signs and nursing note reviewed.  Constitutional:      General: He is not in acute distress.    Appearance: He is well-developed. He is obese. He is ill-appearing.  HENT:     Head: Normocephalic.     Mouth/Throat:     Mouth: Mucous membranes are moist.  Eyes:     Pupils: Pupils are equal, round, and reactive to light.  Neck:     Musculoskeletal: Normal range of motion and neck supple.  Cardiovascular:     Rate and  Rhythm: Normal rate and regular rhythm.  Pulmonary:     Effort: Pulmonary effort is normal.     Breath sounds: Examination of the right-lower field reveals rhonchi. Examination of the left-lower field reveals rhonchi. Rhonchi present.  Abdominal:     General: Bowel sounds are normal.     Palpations: Abdomen is soft.  Musculoskeletal: Normal range of motion.  Skin:    General: Skin is warm and dry.     Capillary Refill: Capillary refill takes less than 2 seconds.  Neurological:     General: No focal deficit present.     Mental Status: He is alert. He is disoriented.  Psychiatric:        Mood and Affect: Mood normal.        Behavior: Behavior normal.      ED Treatments / Results  Labs (all labs ordered are listed, but only abnormal results are displayed) Labs Reviewed  SARS CORONAVIRUS 2 (HOSPITAL ORDER, PERFORMED IN [facility])  CULTURE, BLOOD (ROUTINE X 2)  CULTURE, BLOOD (ROUTINE X 2)  LACTIC ACID, PLASMA  LACTIC ACID, PLASMA  CBC WITH DIFFERENTIAL/PLATELET  COMPREHENSIVE METABOLIC PANEL  D-DIMER, QUANTITATIVE (NOT AT [facility])  PROCALCITONIN  LACTATE DEHYDROGENASE  FERRITIN  TRIGLYCERIDES  FIBRINOGEN  C-REACTIVE PROTEIN    EKG EKG Interpretation  Date/Time:  Friday July 12 2018 18:23:27 EDT Ventricular Rate:  92 PR Interval:    QRS Duration: 95 QT Interval:  332 QTC Calculation: 411 R Axis:   47 Text Interpretation:  Sinus rhythm Borderline T abnormalities, inferior leads Confirmed by Margarita Grizzle [phone]) on 07/12/2018 9:02:29 PM   Radiology Dg Chest Port 1 View  Result Date: 07/12/2018 CLINICAL DATA:  Cough, fever EXAM: PORTABLE CHEST 1 VIEW COMPARISON:  07/05/2018 FINDINGS: The heart size and mediastinal contours are within normal limits. Both lungs are clear. The visualized skeletal structures are unremarkable. IMPRESSION: No active disease. Electronically Signed   By: Elige Ko   On: 07/12/2018 19:43    Procedures Procedures (including  critical care time)  Medications Ordered in ED Medications - No data to display   Initial Impression / Assessment and Plan / ED Course  I have reviewed the triage vital signs and the nursing notes.  Pertinent labs & imaging results that were available during my care of the patient were reviewed by me and considered in my medical decision making (see chart for details).        Brent Ho was evaluated in Emergency Department on 07/12/2018 for the symptoms described in the history of present illness. He  was evaluated in the context of the global COVID-19 pandemic, which necessitated consideration that the patient might be at risk for infection with the SARS-CoV-2 virus that causes COVID-19. Institutional protocols and algorithms that pertain to the evaluation of patients at risk for COVID-19 are in a state of rapid change based on information released by regulatory bodies including the CDC and federal and state organizations. These policies and algorithms were followed during the patient's care in the ED.  Patient Covid positive and on 2 l n/c with sats at 94% No other complaints currently Discussed results via phone with patient Discussed with Dr. Clyde LundborgNiu and will admit to step down Discussed with Dr. Darrick Pennaeterding- pccm to consult  CRITICAL CARE Performed by: Margarita Grizzleanielle Denna Fryberger Total critical care time: 45 minutes Critical care time was exclusive of separately billable procedures and treating other patients. Critical care was necessary to treat or prevent imminent or life-threatening deterioration. Critical care was time spent personally by me on the following activities: development of treatment plan with patient and/or surrogate as well as nursing, discussions with consultants, evaluation of patient's response to treatment, examination of patient, obtaining history from patient or surrogate, ordering and performing treatments and interventions, ordering and review of laboratory studies, ordering and  review of radiographic studies, pulse oximetry and re-evaluation of patient's condition.    Final Clinical Impressions(s) / ED Diagnoses   Final diagnoses:  Acute respiratory disease due to COVID-19 virus    ED Discharge Orders    None       Margarita Grizzleay, Kelilah Hebard, MD 07/12/18 2105

## 2018-07-12 NOTE — ED Triage Notes (Signed)
Pt wife brought pt in today for continued fevers of 102.3 but not given any medications, SOB. Pt was diagnosed with PNA week ago at [facility] and still taking antibiotics.

## 2018-07-12 NOTE — ED Notes (Signed)
Consult to Critical Care, Dr. Ray@20 :46.

## 2018-07-12 NOTE — ED Notes (Addendum)
Niu MD at bedside. 

## 2018-07-12 NOTE — H&P (Addendum)
History and Physical    Brent Ho ZOX:[id number] DOB: 1946/03/19 DOA: 07/12/2018  Referring MD/NP/PA:   PCP: Angelica Chessman, MD   Patient coming from:  The patient is coming from home.  At baseline, pt is independent for most of ADL.        Chief Complaint: Fever, chills, cough, shortness of breath  HPI: Brent Ho is a 73 y.o. male with medical history significant of hypertension, hyperlipidemia, depression, anxiety, who presents with fever, chills, cough, shortness of breath.  Patient states that his symptoms has been going on for more than 8 days, including fever, chills, dry cough cough, shortness of breath.  Patient was seen in the ED on 4/10, and had chest x-ray which showed mild patchy infiltration, and diagnosed as community-acquired pneumonia.  Patient was given prescription for antibiotics.  Patient states that he finished a course of azithromycin, and then started taking Vantin on 4/11, but he only took it until 4/15.  Patient states that he continues to have fever, chills, dry cough and shortness of breath, which have been progressively worsening. Denies chest pain.  No runny nose or sore throat.  He has some diarrhea several days ago, which has resolved.  Currently patient does not have nausea, vomiting, diarrhea, abdominal pain.  Denies symptoms of UTI or unilateral weakness. He denies sick contacts with COVID-19 positive personal.   ED Course: pt was found to have positive COVID 19 test, WBC 9.4, lymphopenia lactic acid 1.5, renal function normal, LDH 436, CRP 3.8, abnormal liver function (ALP 35, AST 57, ALT 38, total bilirubin 1.1), d-dimer 2.66, procalcitonin less than 0.1, ferritin 1247, triglyceride 102, pending fibrinogen, BNP, troponin, IL-6, HIV antibody and G6PD. temperature one 1.3, heart rate 90s, tachypnea, oxygen saturation 87% on room air, which improved to 93 to 95% on 2-3 L nasal cannula oxygen.  Chest x-ray is negative for infiltration today.  Patient is admitted  to stepdown as inpatient.  PCCM, Dr. Darrick Penna was consulted by EDP.  Review of Systems:   General: has fevers, chills, no body weight gain, has poor appetite, has fatigue HEENT: no blurry vision, hearing changes or sore throat Respiratory: has dyspnea, coughing, no wheezing CV: no chest pain, no palpitations GI: no nausea, vomiting, abdominal pain, had diarrhea, no constipation GU: no dysuria, burning on urination, increased urinary frequency, hematuria  Ext: no leg edema Neuro: no unilateral weakness, numbness, or tingling, no vision change or hearing loss Skin: no rash, no skin tear. MSK: No muscle spasm, no deformity, no limitation of range of movement in spin Heme: No easy bruising.  Travel history: No recent long distant travel.  Allergy: No Known Allergies  Past Medical History:  Diagnosis Date   Anxiety    Hypercholesteremia    Hypertension     Past Surgical History:  Procedure Laterality Date   BUNIONECTOMY      Social History:  reports that he has quit smoking. He has never used smokeless tobacco. He reports current alcohol use. He reports that he does not use drugs.  Family History: reviewed with pt, but pt states that he is not clear about family medical hx, and said that someone in family has HTN.  Prior to Admission medications   Medication Sig Start Date End Date Taking? Authorizing Provider  carboxymethylcellulose (REFRESH PLUS) 0.5 % SOLN Place 1 drop into both eyes 3 (three) times daily as needed (dry eyes).   Yes [provider]  cefpodoxime (VANTIN) 200 MG tablet Take 1 tablet (200  mg total) by mouth 2 (two) times daily. 07/06/18  Yes Brent Morn, NP  losartan (COZAAR) 50 MG tablet Take 50 mg by mouth daily. 04/11/15  Yes [provider]  PARoxetine (PAXIL) 20 MG tablet Take 20 mg by mouth daily. 04/11/15  Yes [provider]  simvastatin (ZOCOR) 10 MG tablet Take 10 mg by mouth daily. 04/11/15  Yes [provider]    azithromycin (ZITHROMAX) 250 MG tablet Take 1 tablet (250 mg total) by mouth daily. Take one tablet daily for 4 days Patient not taking: Reported on 07/12/2018 07/06/18   Brent Morn, NP  chlorhexidine (PERIDEX) 0.12 % solution by Mouth Rinse route. Swish with 1/2 a capful before bedtime and spit starting 24 hours after surgery 04/15/15   [provider]  diazepam (VALIUM) 5 MG tablet Take 1 tablet (5 mg total) by mouth every 8 (eight) hours as needed (dizziness). Patient not taking: Reported on 07/12/2018 05/04/15   Brent Bilis, MD  meclizine (ANTIVERT) 25 MG tablet Take 1 tablet (25 mg total) by mouth 3 (three) times daily as needed for dizziness. Patient not taking: Reported on 07/12/2018 05/04/15   Brent Bilis, MD    Physical Exam: Vitals:   07/12/18 1829 07/12/18 1905 07/12/18 2013  BP: (!) 142/83  136/77  Pulse: 91  95  Resp: 20  (!) 31  Temp: (!) 101.3 F (38.5 C)    TempSrc: Oral    SpO2: (!) 87%  93%  Weight:  95.2 kg   Height:  5' 0.75" (1.543 m)    General: Not in acute distress HEENT:       Eyes: PERRL, EOMI, no scleral icterus.       ENT: No discharge from the ears and nose, no pharynx injection, no tonsillar enlargement.        Neck: No JVD, no bruit, no mass felt. Heme: No neck lymph node enlargement. Cardiac: S1/S2, RRR, No murmurs, No gallops or rubs. Respiratory: No rales, wheezing, rhonchi or rubs. GI: Soft, nondistended, nontender, no rebound pain, no organomegaly, BS present. GU: No hematuria Ext: No pitting leg edema bilaterally. 2+DP/PT pulse bilaterally. Musculoskeletal: No joint deformities, No joint redness or warmth, no limitation of ROM in spin. Skin: No rashes.  Neuro: Alert, oriented X3, cranial nerves II-XII grossly intact, moves all extremities normally. Psych: Patient is not psychotic, no suicidal or hemocidal ideation.  Labs on Admission: I have personally reviewed following labs and imaging studies  CBC: Recent Labs  Lab  07/12/18 2003  WBC 9.4  NEUTROABS 8.5*  HGB 14.4  HCT 42.1  MCV 93.3  PLT 221   Basic Metabolic Panel: Recent Labs  Lab 07/12/18 2003  NA 133*  K 4.0  CL 100  CO2 23  GLUCOSE 122*  BUN 24*  CREATININE 0.87  CALCIUM 8.6*   GFR: Estimated Creatinine Clearance: 77.2 mL/min (by C-G formula based on SCr of 0.87 mg/dL). Liver Function Tests: Recent Labs  Lab 07/12/18 2003  AST 57*  ALT 38  ALKPHOS 35*  BILITOT 1.1  PROT 7.2  ALBUMIN 3.3*   No results for input(s): LIPASE, AMYLASE in the last 168 hours. No results for input(s): AMMONIA in the last 168 hours. Coagulation Profile: No results for input(s): INR, PROTIME in the last 168 hours. Cardiac Enzymes: No results for input(s): CKTOTAL, CKMB, CKMBINDEX, TROPONINI in the last 168 hours. BNP (last 3 results) No results for input(s): PROBNP in the last 8760 hours. HbA1C: No results for input(s): HGBA1C in the  last 72 hours. CBG: No results for input(s): GLUCAP in the last 168 hours. Lipid Profile: Recent Labs    07/12/18 2003  TRIG 102   Thyroid Function Tests: No results for input(s): TSH, T4TOTAL, FREET4, T3FREE, THYROIDAB in the last 72 hours. Anemia Panel: Recent Labs    07/12/18 2003  FERRITIN 1,247*   Urine analysis:    Component Value Date/Time   COLORURINE YELLOW 07/05/2018 2347   APPEARANCEUR CLEAR 07/05/2018 2347   LABSPEC 1.025 07/05/2018 2347   PHURINE 6.0 07/05/2018 2347   GLUCOSEU NEGATIVE 07/05/2018 2347   HGBUR NEGATIVE 07/05/2018 2347   BILIRUBINUR NEGATIVE 07/05/2018 2347   KETONESUR NEGATIVE 07/05/2018 2347   PROTEINUR NEGATIVE 07/05/2018 2347   NITRITE NEGATIVE 07/05/2018 2347   LEUKOCYTESUR NEGATIVE 07/05/2018 2347   Sepsis Labs: @LABRCNTIP (procalcitonin:4,lacticidven:4) ) Recent Results (from the past 240 hour(s))  SARS Coronavirus 2 [facility] order, Performed in [facility] hospital lab)     Status: Abnormal   Collection Time: 07/12/18  6:39 PM  Result Value Ref Range  Status   SARS Coronavirus 2 POSITIVE (A) NEGATIVE Final    Comment: RESULT CALLED TO, READ BACK BY AND VERIFIED WITH: FARRAR,S RN @2021  ON 07/12/2018 JACKSON,K (NOTE) If result is NEGATIVE SARS-CoV-2 target nucleic acids are NOT DETECTED. The SARS-CoV-2 RNA is generally detectable in upper and lower  respiratory specimens during the acute phase of infection. The lowest  concentration of SARS-CoV-2 viral copies this assay can detect is 250  copies / mL. A negative result does not preclude SARS-CoV-2 infection  and should not be used as the sole basis for treatment or other  patient management decisions.  A negative result may occur with  improper specimen collection / handling, submission of specimen other  than nasopharyngeal swab, presence of viral mutation(s) within the  areas targeted by this assay, and inadequate number of viral copies  (<250 copies / mL). A negative result must be combined with clinical  observations, patient history, and epidemiological information. If result is POSITIVE SARS-CoV-2 target nucleic acids are DETECTE D. The SARS-CoV-2 RNA is generally detectable in upper and lower  respiratory specimens during the acute phase of infection.  Positive  results are indicative of active infection with SARS-CoV-2.  Clinical  correlation with patient history and other diagnostic information is  necessary to determine patient infection status.  Positive results do  not rule out bacterial infection or co-infection with other viruses. If result is PRESUMPTIVE POSTIVE SARS-CoV-2 nucleic acids MAY BE PRESENT.   A presumptive positive result was obtained on the submitted specimen  and confirmed on repeat testing.  While 2019 novel coronavirus  (SARS-CoV-2) nucleic acids may be present in the submitted sample  additional confirmatory testing may be necessary for epidemiological  and / or clinical management purposes  to differentiate between  SARS-CoV-2 and other Sarbecovirus  currently known to infect humans.  If clinically indicated additional testing with an alternate test  methodology (LAB745 3) is advised. The SARS-CoV-2 RNA is generally  detectable in upper and lower respiratory specimens during the acute  phase of infection. The expected result is Negative. Fact Sheet for Patients:  BoilerBrush.com.cy Fact Sheet for Healthcare Providers: https://pope.com/ This test is not yet approved or cleared by the [place] FDA and has been authorized for detection and/or diagnosis of SARS-CoV-2 by FDA under an Emergency Use Authorization (EUA).  This EUA will remain in effect (meaning this test can be used) for the duration of the COVID-19 declaration under Section 564(b)(1) of  the Act, 21 U.S.C. section 360bbb-3(b)(1), unless the authorization is terminated or revoked sooner. Performed at [facility], [address] [place], [place] [address]      Radiological Exams on Admission: Dg Chest Port 1 View  Result Date: 07/12/2018 CLINICAL DATA:  Cough, fever EXAM: PORTABLE CHEST 1 VIEW COMPARISON:  07/05/2018 FINDINGS: The heart size and mediastinal contours are within normal limits. Both lungs are clear. The visualized skeletal structures are unremarkable. IMPRESSION: No active disease. Electronically Signed   By: Elige KoHetal  Patel   On: 07/12/2018 19:43     EKG: Independently reviewed.  Sinus rhythm, QTC 411, low voltage, nonspecific T wave change.   Assessment/Plan Principal Problem:   COVID-19 virus infection Active Problems:   Hypercholesteremia   Hypertension   Sepsis (HCC)   Acute respiratory failure with hypoxia (HCC)   Depression   Sepsis and acute respiratory failure with hypoxia due to COVID-19 virus infection: pt patient completed a course of azithromycin, and 4 days of Vantin, without improvement.  COVID-19 test positive today.  Oxygen desaturated to 87% on room air, which  improved to 93 to 95% on 2-3 L of nasal cannula oxygen.  Chest x-ray is negative for infiltration.  Patient admits criteria for sepsis with fever, tachypnea.  Lactic acid normal.  Currently hemodynamically stable. H-score not able to be caculated yet due to pending tests.  -will admit to SDU as inpt -start Plaquenil -Atrovent inhaler, PRN Xopenex and PRN Mucinex for cough -Follow-up flu PCR and RVP -f/u Blood culture -Gentle IV fluid: 75 cc/h of normal saline -Follow-up PCCM further recommendations -daily CRP and D-dimer -daily EKG for monitoring Qtc -check stat ABG   Fever: yes Cough: yes SOB: yes URI symtoms: yes GI symptoms: yes Travel: no Sick contacts: no CBC: no leukopenia, has lymphopenia BMP: increased BUN/Cr=24/0.87 LFTs: increased AST/ALT/Tbili-->yes CRP, LDH: increased-->yes Procalcitonin: low-->yes CXR: hazy bilateral peripheral opacities-->no CT chest: GGO, consolidation, crazy paving-->not done COVID subjective risk assessment: low (on O2 <4L) Physician PPE: I used Capr, glove and gown Patient PPE: mask COVID Testing: indicated per current ID/[place] guidelines--> positive result Precaution: Droplet and contact; droplet and airborne  Hypercholesteremia: -zocor  HTN:  -Hold home blood pressure medication, Cozaar since patient is at risk of developing hypotension secondary to sepsis -IV hydralazine prn  Depression: -Continue Paxil   DVT ppx: SQ Lovenox, 40 mg daily Code Status: Full code Family Communication: None at bed side.     Disposition Plan:  Anticipate discharge back to previous home environment Consults called:  none Admission status:  SDU/inpation       Date of Service 07/12/2018    Lorretta HarpXilin Thaine Garriga [facility]   If 7PM-7AM, please contact night-coverage [email or web address] Password [email or web address] 07/12/2018, 10:37 PM

## 2018-07-12 NOTE — ED Notes (Signed)
ED TO INPATIENT HANDOFF REPORT  Name/Age/Gender Brent Ho 73 y.o. male  Code Status    Code Status Orders  (From admission, onward)         Start     Ordered   07/12/18 2201  Full code  Continuous     07/12/18 2204        Code Status History    This patient has a current code status but no historical code status.      Home/SNF/Other Home  Chief Complaint Fever, SOB, Dehyrdated, Dizziness  Level of Care/Admitting Diagnosis ED Disposition    ED Disposition Condition Comment   Admit  Hospital Area: [facility] [[id number]]  Level of Care: Stepdown [14]  Admit to SDU based on following criteria: Hemodynamic compromise or significant risk of instability:  Patient requiring short term acute titration and management of vasoactive drips, and invasive monitoring (i.e., CVP and Arterial line).  Covid Evaluation: Confirmed COVID Positive  Isolation Risk Level: Low Risk  (Less than 4L  supplementation)  Diagnosis: COVID-19 virus infection [[id number]]  Admitting Physician: Lorretta Harp [[id number]]  Attending Physician: Lorretta Harp [[id number]]  Estimated length of stay: past midnight tomorrow  Certification:: I certify this patient will need inpatient services for at least 2 midnights  Bed request comments: Covid 19 infection confirmed, low risk  PT Class (Do Not Modify): Inpatient [101]  PT Acc Code (Do Not Modify): Private [1]       Medical History Past Medical History:  Diagnosis Date  . Anxiety   . Hypercholesteremia   . Hypertension     Allergies No Known Allergies  IV Location/Drains/Wounds Patient Lines/Drains/Airways Status   Active Line/Drains/Airways    Name:   Placement date:   Placement time:   Site:   Days:   Peripheral IV 07/12/18 Right Hand   07/12/18    2004    Hand   less than 1   Peripheral IV 07/12/18 Left Hand   07/12/18    2004    Hand   less than 1          Labs/Imaging Results for orders placed or performed during the hospital  encounter of 07/12/18 (from the past 48 hour(s))  SARS Coronavirus 2 [facility] order, Performed in [facility] hospital lab)     Status: Abnormal   Collection Time: 07/12/18  6:39 PM  Result Value Ref Range   SARS Coronavirus 2 POSITIVE (A) NEGATIVE    Comment: RESULT CALLED TO, READ BACK BY AND VERIFIED WITH: FARRAR,S RN @2021  ON 07/12/2018 JACKSON,K (NOTE) If result is NEGATIVE SARS-CoV-2 target nucleic acids are NOT DETECTED. The SARS-CoV-2 RNA is generally detectable in upper and lower  respiratory specimens during the acute phase of infection. The lowest  concentration of SARS-CoV-2 viral copies this assay can detect is 250  copies / mL. A negative result does not preclude SARS-CoV-2 infection  and should not be used as the sole basis for treatment or other  patient management decisions.  A negative result may occur with  improper specimen collection / handling, submission of specimen other  than nasopharyngeal swab, presence of viral mutation(s) within the  areas targeted by this assay, and inadequate number of viral copies  (<250 copies / mL). A negative result must be combined with clinical  observations, patient history, and epidemiological information. If result is POSITIVE SARS-CoV-2 target nucleic acids are DETECTE D. The SARS-CoV-2 RNA is generally detectable in upper and lower  respiratory specimens during the acute phase  of infection.  Positive  results are indicative of active infection with SARS-CoV-2.  Clinical  correlation with patient history and other diagnostic information is  necessary to determine patient infection status.  Positive results do  not rule out bacterial infection or co-infection with other viruses. If result is PRESUMPTIVE POSTIVE SARS-CoV-2 nucleic acids MAY BE PRESENT.   A presumptive positive result was obtained on the submitted specimen  and confirmed on repeat testing.  While 2019 novel coronavirus  (SARS-CoV-2) nucleic acids may be present  in the submitted sample  additional confirmatory testing may be necessary for epidemiological  and / or clinical management purposes  to differentiate between  SARS-CoV-2 and other Sarbecovirus currently known to infect humans.  If clinically indicated additional testing with an alternate test  methodology (LAB745 3) is advised. The SARS-CoV-2 RNA is generally  detectable in upper and lower respiratory specimens during the acute  phase of infection. The expected result is Negative. Fact Sheet for Patients:  BoilerBrush.com.cy Fact Sheet for Healthcare Providers: https://pope.com/ This test is not yet approved or cleared by the [place] FDA and has been authorized for detection and/or diagnosis of SARS-CoV-2 by FDA under an Emergency Use Authorization (EUA).  This EUA will remain in effect (meaning this test can be used) for the duration of the COVID-19 declaration under Section 564(b)(1) of the Act, 21 U.S.C. section 360bbb-3(b)(1), unless the authorization is terminated or revoked sooner. Performed at [facility], [address], [place], [place] [address]   Lactic acid, plasma     Status: None   Collection Time: 07/12/18  8:03 PM  Result Value Ref Range   Lactic Acid, Venous 1.5 0.5 - 1.9 mmol/L    Comment: Performed at [facility], [address] [place], [place] [address]  CBC WITH DIFFERENTIAL     Status: Abnormal   Collection Time: 07/12/18  8:03 PM  Result Value Ref Range   WBC 9.4 4.0 - 10.5 K/uL   RBC 4.51 4.22 - 5.81 MIL/uL   Hemoglobin 14.4 13.0 - 17.0 g/dL   HCT 09.8 11.9 - 14.7 %   MCV 93.3 80.0 - 100.0 fL   MCH 31.9 26.0 - 34.0 pg   MCHC 34.2 30.0 - 36.0 g/dL   RDW 82.9 56.2 - 13.0 %   Platelets 221 150 - 400 K/uL   nRBC 0.0 0.0 - 0.2 %   Neutrophils Relative % 90 %   Neutro Abs 8.5 (H) 1.7 - 7.7 K/uL   Lymphocytes Relative 6 %   Lymphs Abs 0.5 (L) 0.7 - 4.0 K/uL    Monocytes Relative 3 %   Monocytes Absolute 0.3 0.1 - 1.0 K/uL   Eosinophils Relative 0 %   Eosinophils Absolute 0.0 0.0 - 0.5 K/uL   Basophils Relative 0 %   Basophils Absolute 0.0 0.0 - 0.1 K/uL   WBC Morphology MORPHOLOGY UNREMARKABLE    Immature Granulocytes 1 %   Abs Immature Granulocytes 0.12 (H) 0.00 - 0.07 K/uL    Comment: Performed at [facility], [address], [place], [place] [address]  Comprehensive metabolic panel     Status: Abnormal   Collection Time: 07/12/18  8:03 PM  Result Value Ref Range   Sodium 133 (L) 135 - 145 mmol/L   Potassium 4.0 3.5 - 5.1 mmol/L   Chloride 100 98 - 111 mmol/L   CO2 23 22 - 32 mmol/L   Glucose, Bld 122 (H) 70 - 99 mg/dL  BUN 24 (H) 8 - 23 mg/dL   Creatinine, Ser 1.610.87 0.61 - 1.24 mg/dL   Calcium 8.6 (L) 8.9 - 10.3 mg/dL   Total Protein 7.2 6.5 - 8.1 g/dL   Albumin 3.3 (L) 3.5 - 5.0 g/dL   AST 57 (H) 15 - 41 U/L   ALT 38 0 - 44 U/L   Alkaline Phosphatase 35 (L) 38 - 126 U/L   Total Bilirubin 1.1 0.3 - 1.2 mg/dL   GFR calc non Af Amer >60 >60 mL/min   GFR calc Af Amer >60 >60 mL/min   Anion gap 10 5 - 15    Comment: Performed at [facility], [address] [place], [place] [address]  D-dimer, quantitative     Status: Abnormal   Collection Time: 07/12/18  8:03 PM  Result Value Ref Range   D-Dimer, Quant 2.66 (H) 0.00 - 0.50 ug/mL-FEU    Comment: (NOTE) At the manufacturer cut-off of 0.50 ug/mL FEU, this assay has been documented to exclude PE with a sensitivity and negative predictive value of 97 to 99%.  At this time, this assay has not been approved by the FDA to exclude DVT/VTE. Results should be correlated with clinical presentation. Performed at [facility], [address] [place], [place] [address]   Procalcitonin     Status: None   Collection Time: 07/12/18  8:03 PM  Result Value Ref Range   Procalcitonin <0.10 ng/mL    Comment:        Interpretation: PCT  (Procalcitonin) <= 0.5 ng/mL: Systemic infection (sepsis) is not likely. Local bacterial infection is possible. (NOTE)       Sepsis PCT Algorithm           Lower Respiratory Tract                                      Infection PCT Algorithm    ----------------------------     ----------------------------         PCT < 0.25 ng/mL                PCT < 0.10 ng/mL         Strongly encourage             Strongly discourage   discontinuation of antibiotics    initiation of antibiotics    ----------------------------     -----------------------------       PCT 0.25 - 0.50 ng/mL            PCT 0.10 - 0.25 ng/mL               OR       >80% decrease in PCT            Discourage initiation of                                            antibiotics      Encourage discontinuation           of antibiotics    ----------------------------     -----------------------------         PCT >= 0.50 ng/mL              PCT 0.26 - 0.50 ng/mL  AND        <80% decrease in PCT             Encourage initiation of                                             antibiotics       Encourage continuation           of antibiotics    ----------------------------     -----------------------------        PCT >= 0.50 ng/mL                  PCT > 0.50 ng/mL               AND         increase in PCT                  Strongly encourage                                      initiation of antibiotics    Strongly encourage escalation           of antibiotics                                     -----------------------------                                           PCT <= 0.25 ng/mL                                                 OR                                        > 80% decrease in PCT                                     Discontinue / Do not initiate                                             antibiotics Performed at [facility], [address] [place], [place] [address]   Lactate  dehydrogenase     Status: Abnormal   Collection Time: 07/12/18  8:03 PM  Result Value Ref Range   LDH 436 (H) 98 - 192 U/L    Comment: Performed at [facility], [address] [place], [place] [address]  Ferritin     Status: Abnormal   Collection Time: 07/12/18  8:03 PM  Result Value Ref Range   Ferritin 1,247 (H) 24 - 336 ng/mL    Comment: Performed at [facility], [address] [place], [place] [address]  Triglycerides     Status: None   Collection Time: 07/12/18  8:03 PM  Result Value Ref Range   Triglycerides 102 <150 mg/dL    Comment: Performed at [facility], [address] [place], [place] [address]  Fibrinogen     Status: Abnormal   Collection Time: 07/12/18  8:03 PM  Result Value Ref Range   Fibrinogen 722 (H) 210 - 475 mg/dL    Comment: Performed at [facility], [address] [place], [place] [address]  C-reactive protein     Status: Abnormal   Collection Time: 07/12/18  8:03 PM  Result Value Ref Range   CRP 3.8 (H) <1.0 mg/dL    Comment: Performed at [facility], [address], [place], [place] [address]   Dg Chest Port 1 View  Result Date: 07/12/2018 CLINICAL DATA:  Cough, fever EXAM: PORTABLE CHEST 1 VIEW COMPARISON:  07/05/2018 FINDINGS: The heart size and mediastinal contours are within normal limits. Both lungs are clear. The visualized skeletal structures are unremarkable. IMPRESSION: No active disease. Electronically Signed   By: Elige Ko   On: 07/12/2018 19:43    Pending Labs Unresulted Labs (From admission, onward)    Start     Ordered   07/13/18 0500  Comprehensive metabolic panel  Daily,   R     07/12/18 2204   07/13/18 0500  CBC with Differential/Platelet  Daily,   R     07/12/18 2204   07/13/18 0500  CK  Daily,   R     07/12/18 2204   07/13/18 0500  D-dimer, quantitative (not at [facility])  Daily,   R     07/12/18 2204   07/13/18 0500  C-reactive  protein  Daily,   R     07/12/18 2204   07/12/18 2205  Legionella Pneumophila Serogp 1 Ur Ag  Once,   R     07/12/18 2204   07/12/18 2205  Strep pneumoniae urinary antigen  Once,   R     07/12/18 2204   07/12/18 2204  Culture, sputum-assessment  Once,   R     07/12/18 2204   07/12/18 2203  Brain natriuretic peptide  Once,   R     07/12/18 2204   07/12/18 2203  Glucose 6 phosphate dehydrogenase  Once,   R     07/12/18 2204   07/12/18 2203  Hepatitis B surface antigen  Once,   R     07/12/18 2204   07/12/18 2203  Interleukin-6, Plasma  Once,   R     07/12/18 2204   07/12/18 2203  Troponin I - Once  Once,   R     07/12/18 2204   07/12/18 2200  HIV antibody (Routine Testing)  Once,   R     07/12/18 2204   07/12/18 2200  ABO/Rh  Once,   R     07/12/18 2204   07/12/18 2102  Influenza panel by PCR (type A & B)  Add-on,   R     07/12/18 2101   07/12/18 2102  Respiratory Panel by PCR  (Respiratory virus panel with precautions)  Add-on,   R     07/12/18 2101   07/12/18 1839  Blood Culture (routine x 2)  BLOOD CULTURE X 2,   STAT    Question:  Patient immune status  Answer:  Normal   07/12/18 1839  Vitals/Pain Today's Vitals   07/12/18 1829 07/12/18 1905 07/12/18 2013 07/12/18 2230  BP: (!) 142/83  136/77 133/86  Pulse: 91  95 95  Resp: 20  (!) 31 (!) 33  Temp: (!) 101.3 F (38.5 C)     TempSrc: Oral     SpO2: (!) 87%  93% 92%  Weight:  95.2 kg    Height:  5' 0.75" (1.543 m)    PainSc:        Isolation Precautions Droplet and Contact precautions  Medications Medications  simvastatin (ZOCOR) tablet 10 mg (has no administration in time range)  PARoxetine (PAXIL) tablet 20 mg (has no administration in time range)  dextromethorphan-guaiFENesin (MUCINEX DM) 30-600 MG per 12 hr tablet 1 tablet (has no administration in time range)  albuterol (VENTOLIN HFA) 108 (90 Base) MCG/ACT inhaler 2 puff (has no administration in time range)  albuterol (VENTOLIN HFA) 108 (90  Base) MCG/ACT inhaler 2 puff (has no administration in time range)  hydrALAZINE (APRESOLINE) injection 5 mg (has no administration in time range)  enoxaparin (LOVENOX) injection 40 mg (has no administration in time range)  acetaminophen (TYLENOL) tablet 650 mg (has no administration in time range)  senna-docusate (Senokot-S) tablet 1 tablet (has no administration in time range)  ondansetron (ZOFRAN) tablet 4 mg (has no administration in time range)    Or  ondansetron (ZOFRAN) injection 4 mg (has no administration in time range)  hydroxychloroquine (PLAQUENIL) tablet 400 mg (has no administration in time range)    Followed by  hydroxychloroquine (PLAQUENIL) tablet 200 mg (has no administration in time range)  0.9 %  sodium chloride infusion (has no administration in time range)    Mobility walks

## 2018-07-13 LAB — BLOOD GAS, ARTERIAL
Acid-Base Excess: 2.7 mmol/L — ABNORMAL HIGH (ref 0.0–2.0)
Bicarbonate: 24.7 mmol/L (ref 20.0–28.0)
Drawn by: 235321
O2 Content: 2 L/min
O2 Saturation: 92.6 %
Patient temperature: 99.6
pCO2 arterial: 32.5 mmHg (ref 32.0–48.0)
pH, Arterial: 7.494 — ABNORMAL HIGH (ref 7.350–7.450)
pO2, Arterial: 64.2 mmHg — ABNORMAL LOW (ref 83.0–108.0)

## 2018-07-13 LAB — RESPIRATORY PANEL BY PCR

## 2018-07-13 LAB — COMPREHENSIVE METABOLIC PANEL
ALT: 39 U/L (ref 0–44)
AST: 59 U/L — ABNORMAL HIGH (ref 15–41)
Albumin: 3.3 g/dL — ABNORMAL LOW (ref 3.5–5.0)
Alkaline Phosphatase: 34 U/L — ABNORMAL LOW (ref 38–126)
Anion gap: 10 (ref 5–15)
BUN: 24 mg/dL — ABNORMAL HIGH (ref 8–23)
CO2: 23 mmol/L (ref 22–32)
Calcium: 8.7 mg/dL — ABNORMAL LOW (ref 8.9–10.3)
Chloride: 101 mmol/L (ref 98–111)
Creatinine, Ser: 0.87 mg/dL (ref 0.61–1.24)
GFR calc Af Amer: >60 mL/min (ref >60)
GFR calc non Af Amer: >60 mL/min (ref >60)
Glucose, Bld: 119 mg/dL — ABNORMAL HIGH (ref 70–99)
Potassium: 4.1 mmol/L (ref 3.5–5.1)
Sodium: 134 mmol/L — ABNORMAL LOW (ref 135–145)
Total Bilirubin: 1.2 mg/dL (ref 0.3–1.2)
Total Protein: 7.3 g/dL (ref 6.5–8.1)

## 2018-07-13 LAB — CBC WITH DIFFERENTIAL/PLATELET
Abs Immature Granulocytes: 0.08 10*3/uL — ABNORMAL HIGH (ref 0.00–0.07)
Basophils Absolute: 0 10*3/uL (ref 0.0–0.1)
Basophils Relative: 0 %
Eosinophils Absolute: 0 10*3/uL (ref 0.0–0.5)
Eosinophils Relative: 0 %
HCT: 42.5 % (ref 39.0–52.0)
Hemoglobin: 14.4 g/dL (ref 13.0–17.0)
Immature Granulocytes: 1 %
Lymphocytes Relative: 9 %
Lymphs Abs: 0.8 10*3/uL (ref 0.7–4.0)
MCH: 31.9 pg (ref 26.0–34.0)
MCHC: 33.9 g/dL (ref 30.0–36.0)
MCV: 94 fL (ref 80.0–100.0)
Monocytes Absolute: 0.2 10*3/uL (ref 0.1–1.0)
Monocytes Relative: 3 %
Neutro Abs: 7.8 10*3/uL — ABNORMAL HIGH (ref 1.7–7.7)
Neutrophils Relative %: 87 %
Platelets: 222 10*3/uL (ref 150–400)
RBC: 4.52 MIL/uL (ref 4.22–5.81)
RDW: 12.2 % (ref 11.5–15.5)
WBC: 8.9 10*3/uL (ref 4.0–10.5)
nRBC: 0 % (ref 0.0–0.2)

## 2018-07-13 LAB — BRAIN NATRIURETIC PEPTIDE: B Natriuretic Peptide: 17.3 pg/mL (ref 0.0–100.0)

## 2018-07-13 LAB — TYPE AND SCREEN
ABO/RH(D): A POS
Antibody Screen: NEGATIVE

## 2018-07-13 LAB — CK: Total CK: 251 U/L (ref 49–397)

## 2018-07-13 LAB — C-REACTIVE PROTEIN: CRP: 3.9 mg/dL — ABNORMAL HIGH (ref <1.0)

## 2018-07-13 LAB — TROPONIN I: Troponin I: <0.03 ng/mL (ref <0.03)

## 2018-07-13 LAB — INFLUENZA PANEL BY PCR (TYPE A & B)
Influenza A By PCR: NEGATIVE
Influenza B By PCR: NEGATIVE

## 2018-07-13 LAB — STREP PNEUMONIAE URINARY ANTIGEN: Strep Pneumo Urinary Antigen: NEGATIVE

## 2018-07-13 LAB — HIV ANTIBODY (ROUTINE TESTING W REFLEX): HIV Screen 4th Generation wRfx: NONREACTIVE

## 2018-07-13 LAB — D-DIMER, QUANTITATIVE: D-Dimer, Quant: 2.63 ug/mL-FEU — ABNORMAL HIGH (ref 0.00–0.50)

## 2018-07-13 MED ORDER — ORAL CARE MOUTH RINSE
15.0000 mL | Freq: Two times a day (BID) | OROMUCOSAL | Status: DC
  Administered 2018-07-13 – 2018-07-19 (×11): 15 mL via OROMUCOSAL

## 2018-07-13 NOTE — Progress Notes (Signed)
Pt arrived to unit at approximately 1730, he was transferred from gurney to bed, tolerated well.  VS taken, and stable. Pt IV set up and placed on pump. MD at bedside at approximately 1830 - advised to lay prone approximately 2 -3 hrs a day - and to use Incentive Spirometer while awake. Pt was ordered his dinner.

## 2018-07-13 NOTE — Progress Notes (Signed)
PROGRESS NOTE    Brent NumbersReid Onley  NWG:[id number] DOB: 31-Jan-1946 DOA: 07/12/2018 PCP: Angelica ChessmanAguiar, Rafaela M, MD   Brief Narrative: Brent Ho is a 73 y.o. male with medical history significant of hypertension, hyperlipidemia, depression, anxiety. He presented with a week of fever/cough/chills. He was previously diagnosed with a community acquired pneumonia but is COVID-19 positive. Started on Plaquenil. On 4L via nasal canula. [facility]   Assessment & Plan:   Principal Problem:   COVID-19 virus infection Active Problems:   Hypercholesteremia   Hypertension   Sepsis (HCC)   Acute respiratory failure with hypoxia (HCC)   Depression   COVID-19 infection Patient with continued fevers. Per history, fever curve down slightly. On 4L via nasal canula as mentioned below. Started on hydroxychloroquine -Telemetry, monitor QTc daily -Supportive care -Daily ferritin/d-dimer/CBC/mag/d-dimer/CMP  Acute respiratory failure with hypoxia In setting of infection mentioned above. Currently stable on 4L via nasal canula -Wean to room air as able  Essential hypertension On losartan as an outpatient. Held secondary to soft blood pressures -Continue hydralazine  Hyperlipidemia -Continue Zocor   DVT prophylaxis: Lovenox Code Status:   Code Status: Full Code Family Communication: Wife on telephone [facility]   Consultants:   None  Procedures:   None  Antimicrobials:  Plaquenil    Subjective: No dyspnea. No chest pain.  Objective: Vitals:   07/13/18 0200 07/13/18 0309 07/13/18 0623 07/13/18 0800  BP: (!) 157/79  (!) 102/58 (!) 146/73  Pulse: 98  81 75  Resp: 16  20 13   Temp:  (!) 100.8 F (38.2 C) 97.8 F (36.6 C)   TempSrc:  Oral Axillary   SpO2: 91% 92% 93% 95%  Weight:      Height:        Intake/Output Summary (Last 24 hours) at 07/13/2018 1102 Last data filed at 07/13/2018 0600 Gross per 24 hour  Intake 640.95 ml  Output -  Net  640.95 ml   Filed Weights   07/12/18 1905 07/13/18 0121  Weight: 95.2 kg 84.2 kg    Examination:  General exam: Appears calm and comfortable Respiratory system: Diminished with bilateral rales. Respiratory effort normal. Cardiovascular system: S1 & S2 heard, RRR. No murmurs, rubs, gallops or clicks. Gastrointestinal system: Abdomen is nondistended, soft and nontender. No organomegaly or masses felt. Normal bowel sounds heard. Central nervous system: Alert and oriented. No focal neurological deficits. Extremities: No edema. No calf tenderness Skin: No cyanosis. No rashes Psychiatry: Judgement and insight appear normal. Mood & affect appropriate.     Data Reviewed: I have personally reviewed following labs and imaging studies  CBC: Recent Labs  Lab 07/12/18 2003 07/13/18 0140  WBC 9.4 8.9  NEUTROABS 8.5* 7.8*  HGB 14.4 14.4  HCT 42.1 42.5  MCV 93.3 94.0  PLT 221 222   Basic Metabolic Panel: Recent Labs  Lab 07/12/18 2003 07/13/18 0140  NA 133* 134*  K 4.0 4.1  CL 100 101  CO2 23 23  GLUCOSE 122* 119*  BUN 24* 24*  CREATININE 0.87 0.87  CALCIUM 8.6* 8.7*   GFR: Estimated Creatinine Clearance: 72.3 mL/min (by C-G formula based on SCr of 0.87 mg/dL). Liver Function Tests: Recent Labs  Lab 07/12/18 2003 07/13/18 0140  AST 57* 59*  ALT 38 39  ALKPHOS 35* 34*  BILITOT 1.1 1.2  PROT 7.2 7.3  ALBUMIN 3.3* 3.3*   No results for input(s): LIPASE, AMYLASE in the last 168 hours. No results for input(s): AMMONIA in the  last 168 hours. Coagulation Profile: No results for input(s): INR, PROTIME in the last 168 hours. Cardiac Enzymes: Recent Labs  Lab 07/13/18 0140  CKTOTAL 251  TROPONINI <0.03   BNP (last 3 results) No results for input(s): PROBNP in the last 8760 hours. HbA1C: No results for input(s): HGBA1C in the last 72 hours. CBG: No results for input(s): GLUCAP in the last 168 hours. Lipid Profile: Recent Labs    07/12/18 2003  TRIG 102    Thyroid Function Tests: No results for input(s): TSH, T4TOTAL, FREET4, T3FREE, THYROIDAB in the last 72 hours. Anemia Panel: Recent Labs    07/12/18 2003  FERRITIN 1,247*   Sepsis Labs: Recent Labs  Lab 07/12/18 2003  PROCALCITON <0.10  LATICACIDVEN 1.5    Recent Results (from the past 240 hour(s))  SARS Coronavirus 2 [facility] order, Performed in [facility] hospital lab)     Status: Abnormal   Collection Time: 07/12/18  6:39 PM  Result Value Ref Range Status   SARS Coronavirus 2 POSITIVE (A) NEGATIVE Final    Comment: RESULT CALLED TO, READ BACK BY AND VERIFIED WITH: FARRAR,S RN @2021  ON 07/12/2018 JACKSON,K (NOTE) If result is NEGATIVE SARS-CoV-2 target nucleic acids are NOT DETECTED. The SARS-CoV-2 RNA is generally detectable in upper and lower  respiratory specimens during the acute phase of infection. The lowest  concentration of SARS-CoV-2 viral copies this assay can detect is 250  copies / mL. A negative result does not preclude SARS-CoV-2 infection  and should not be used as the sole basis for treatment or other  patient management decisions.  A negative result may occur with  improper specimen collection / handling, submission of specimen other  than nasopharyngeal swab, presence of viral mutation(s) within the  areas targeted by this assay, and inadequate number of viral copies  (<250 copies / mL). A negative result must be combined with clinical  observations, patient history, and epidemiological information. If result is POSITIVE SARS-CoV-2 target nucleic acids are DETECTE D. The SARS-CoV-2 RNA is generally detectable in upper and lower  respiratory specimens during the acute phase of infection.  Positive  results are indicative of active infection with SARS-CoV-2.  Clinical  correlation with patient history and other diagnostic information is  necessary to determine patient infection status.  Positive results do  not rule out bacterial infection or  co-infection with other viruses. If result is PRESUMPTIVE POSTIVE SARS-CoV-2 nucleic acids MAY BE PRESENT.   A presumptive positive result was obtained on the submitted specimen  and confirmed on repeat testing.  While 2019 novel coronavirus  (SARS-CoV-2) nucleic acids may be present in the submitted sample  additional confirmatory testing may be necessary for epidemiological  and / or clinical management purposes  to differentiate between  SARS-CoV-2 and other Sarbecovirus currently known to infect humans.  If clinically indicated additional testing with an alternate test  methodology (LAB745 3) is advised. The SARS-CoV-2 RNA is generally  detectable in upper and lower respiratory specimens during the acute  phase of infection. The expected result is Negative. Fact Sheet for Patients:  BoilerBrush.com.cy Fact Sheet for Healthcare Providers: https://pope.com/ This test is not yet approved or cleared by the [place] FDA and has been authorized for detection and/or diagnosis of SARS-CoV-2 by FDA under an Emergency Use Authorization (EUA).  This EUA will remain in effect (meaning this test can be used) for the duration of the COVID-19 declaration under Section 564(b)(1) of the Act, 21 U.S.C. section 360bbb-3(b)(1), unless the authorization  is terminated or revoked sooner. Performed at [facility], [address] [place], [place] [address]   Blood Culture (routine x 2)     Status: None (Preliminary result)   Collection Time: 07/12/18  7:50 PM  Result Value Ref Range Status   Specimen Description   Final    BLOOD LEFT HAND Performed at [facility], [address], [place], [place] [address]    Special Requests   Final    BOTTLES DRAWN AEROBIC AND ANAEROBIC Blood Culture results may not be optimal due to an excessive volume of blood received in culture bottles Performed at [facility], [address] [place], [place] [address]    Culture   Final    NO GROWTH < 12 HOURS Performed at [facility], [address], [place], [place] [address]    Report Status PENDING  Incomplete  Blood Culture (routine x 2)     Status: None (Preliminary result)   Collection Time: 07/12/18  8:03 PM  Result Value Ref Range Status   Specimen Description   Final    BLOOD RIGHT HAND Performed at [facility], [address] [place], [place] [address]    Special Requests   Final    BOTTLES DRAWN AEROBIC AND ANAEROBIC Blood Culture adequate volume Performed at [facility], [address], [place], [place] [address]    Culture   Final    NO GROWTH < 12 HOURS Performed at [facility], [address] [place], [place] [address]    Report Status PENDING  Incomplete         Radiology Studies: Dg Chest Port 1 View  Result Date: 07/12/2018 CLINICAL DATA:  Cough, fever EXAM: PORTABLE CHEST 1 VIEW COMPARISON:  07/05/2018 FINDINGS: The heart size and mediastinal contours are within normal limits. Both lungs are clear. The visualized skeletal structures are unremarkable. IMPRESSION: No active disease. Electronically Signed   By: Elige Ko   On: 07/12/2018 19:43        Scheduled Meds: . albuterol  2 puff Inhalation TID  . enoxaparin (LOVENOX) injection  40 mg Subcutaneous Daily  . hydroxychloroquine  200 mg Oral BID  . mouth rinse  15 mL Mouth Rinse BID  . PARoxetine  20 mg Oral Daily  . simvastatin  10 mg Oral q1800   Continuous Infusions: . sodium chloride 75 mL/hr at 07/13/18 0600     LOS: 1 day     Jacquelin Hawking, MD [facility] 07/13/2018, 11:02 AM  If 7PM-7AM, please contact night-coverage [email or web address]

## 2018-07-13 NOTE — Progress Notes (Signed)
Tried to get patient to turn over and sleep prone  On three different attempts. I educated patient each time on the importance and reason of sleeping prone but patient still refuses. Will continue to reinforce education and monitor patient closely.

## 2018-07-13 NOTE — Plan of Care (Signed)

## 2018-07-13 NOTE — ED Notes (Signed)
Report given to floor, Occupational psychologist

## 2018-07-13 NOTE — Progress Notes (Signed)
PROGRESS NOTE  Brent Ho is a 73 y.o. male with a history of HTN, HLD, depression and anxiety who was admitted last night at [facility] for acute hypoxic respiratory failure found to be covid-positive and transferred to [facility] today. Patient personally seen, interviewed, and examined this evening. He is in no distress, breathing comfortable with some coarse breath sounds, no wheezes or crackles. No LE edema or JVD. On 3L by [place], good saturations.   COVID-19 pneumonia w/resp failure:  - Continue HCQ, currently stable not needing toci.   - AM labs for trending ordered - Airborne/contact precautions if oxygen demand goes up.  - Discussed proning and incentive spirometry, patient resistant to proning. Will perform IS.  Tyrone Nine, MD Pager [phone] 07/13/2018, 7:00 PM

## 2018-07-14 LAB — COMPREHENSIVE METABOLIC PANEL
ALT: 40 U/L (ref 0–44)
AST: 58 U/L — ABNORMAL HIGH (ref 15–41)
Albumin: 2.8 g/dL — ABNORMAL LOW (ref 3.5–5.0)
Alkaline Phosphatase: 35 U/L — ABNORMAL LOW (ref 38–126)
Anion gap: 8 (ref 5–15)
BUN: 20 mg/dL (ref 8–23)
CO2: 26 mmol/L (ref 22–32)
Calcium: 8.3 mg/dL — ABNORMAL LOW (ref 8.9–10.3)
Chloride: 102 mmol/L (ref 98–111)
Creatinine, Ser: 0.73 mg/dL (ref 0.61–1.24)
GFR calc Af Amer: >60 mL/min (ref >60)
GFR calc non Af Amer: >60 mL/min (ref >60)
Glucose, Bld: 110 mg/dL — ABNORMAL HIGH (ref 70–99)
Potassium: 4.3 mmol/L (ref 3.5–5.1)
Sodium: 136 mmol/L (ref 135–145)
Total Bilirubin: 1.1 mg/dL (ref 0.3–1.2)
Total Protein: 6.5 g/dL (ref 6.5–8.1)

## 2018-07-14 LAB — CBC WITH DIFFERENTIAL/PLATELET
Abs Immature Granulocytes: 0.16 10*3/uL — ABNORMAL HIGH (ref 0.00–0.07)
Basophils Absolute: 0.1 10*3/uL (ref 0.0–0.1)
Basophils Relative: 1 %
Eosinophils Absolute: 0 10*3/uL (ref 0.0–0.5)
Eosinophils Relative: 1 %
HCT: 39.1 % (ref 39.0–52.0)
Hemoglobin: 13.2 g/dL (ref 13.0–17.0)
Immature Granulocytes: 2 %
Lymphocytes Relative: 11 %
Lymphs Abs: 0.9 10*3/uL (ref 0.7–4.0)
MCH: 32 pg (ref 26.0–34.0)
MCHC: 33.8 g/dL (ref 30.0–36.0)
MCV: 94.9 fL (ref 80.0–100.0)
Monocytes Absolute: 0.2 10*3/uL (ref 0.1–1.0)
Monocytes Relative: 3 %
Neutro Abs: 6.6 10*3/uL (ref 1.7–7.7)
Neutrophils Relative %: 82 %
Platelets: 230 10*3/uL (ref 150–400)
RBC: 4.12 MIL/uL — ABNORMAL LOW (ref 4.22–5.81)
RDW: 12.3 % (ref 11.5–15.5)
WBC Morphology: INCREASED
WBC: 7.9 10*3/uL (ref 4.0–10.5)
nRBC: 0 % (ref 0.0–0.2)

## 2018-07-14 LAB — GLUCOSE 6 PHOSPHATE DEHYDROGENASE
G6PDH: 7.3 U/g{Hb} (ref 4.6–13.5)
Hemoglobin: 14.5 g/dL (ref 13.0–17.7)

## 2018-07-14 LAB — D-DIMER, QUANTITATIVE: D-Dimer, Quant: 2.15 ug/mL-FEU — ABNORMAL HIGH (ref 0.00–0.50)

## 2018-07-14 LAB — C-REACTIVE PROTEIN: CRP: 3.7 mg/dL — ABNORMAL HIGH (ref <1.0)

## 2018-07-14 LAB — MAGNESIUM: Magnesium: 2.6 mg/dL — ABNORMAL HIGH (ref 1.7–2.4)

## 2018-07-14 LAB — INTERLEUKIN-6, PLASMA: Interleukin-6, Plasma: 57.5 pg/mL — ABNORMAL HIGH (ref 0.0–12.2)

## 2018-07-14 LAB — CK: Total CK: 132 U/L (ref 49–397)

## 2018-07-14 LAB — FERRITIN: Ferritin: 1323 ng/mL — ABNORMAL HIGH (ref 24–336)

## 2018-07-14 LAB — FIBRINOGEN: Fibrinogen: 686 mg/dL — ABNORMAL HIGH (ref 210–475)

## 2018-07-14 LAB — HEPATITIS B SURFACE ANTIGEN: Hepatitis B Surface Ag: NEGATIVE

## 2018-07-14 MED ORDER — SALINE SPRAY 0.65 % NA SOLN
1.0000 | NASAL | Status: DC | PRN
  Filled 2018-07-14: qty 44

## 2018-07-14 MED ORDER — ALBUTEROL SULFATE HFA 108 (90 BASE) MCG/ACT IN AERS
2.0000 | INHALATION_SPRAY | Freq: Two times a day (BID) | RESPIRATORY_TRACT | Status: DC
  Administered 2018-07-14 – 2018-07-19 (×10): 2 via RESPIRATORY_TRACT
  Filled 2018-07-14: qty 6.7

## 2018-07-14 MED ORDER — POLYVINYL ALCOHOL 1.4 % OP SOLN
1.0000 [drp] | OPHTHALMIC | Status: DC | PRN
  Administered 2018-07-14 – 2018-07-19 (×3): 1 [drp] via OPHTHALMIC
  Filled 2018-07-14: qty 15

## 2018-07-14 NOTE — Progress Notes (Signed)
Pt states he is feeling better overall today with improved appetite, decreased shortness of breath, and decrease in coughing episodes, lungs are still diminished but clear this evening, no cough noted, foley patent for clear amber urine, vss, sr on monitor, sats 93-95% on 3 liters [place], will continue to monitor.

## 2018-07-14 NOTE — Progress Notes (Signed)
PROGRESS NOTE  Brent Ho  ZOX:[id number] DOB: [birth year] DOA: 07/12/2018 PCP: Angelica Chessman, MD   Brief Narrative: Brent Ho is a 73 y.o. male with a history of HTN, HLD, anxiety/depression who presented with a little over a week of fever. He had been evaluated at [facility] on 4/10, diagnosed with CAP treated with ceftriaxone, azithromycin and discharged on vantin and azithromycin which he took for 5 days without improvement in symptoms. As fever and cough worsened he noticed shortness of breath and presented to [facility] ED on 4/17 where he was found to be hypoxic with positive covid testing as well as lymphopenia, CRP 3.8, LDH 436, mild LFT elevations, d-dimer 2.66, ferritin 1247, and undetectable procalcitonin. CXR showed no focal infiltrate. He was admitted initially to [facility] SDU and promptly transferred to [facility] 4/18 on 3L supplemental oxygen and has had fevers overnight.  Assessment & Plan: Principal Problem:   COVID-19 virus infection Active Problems:   Hypercholesteremia   Hypertension   Sepsis (HCC)   Acute respiratory failure with hypoxia (HCC)   Depression  Sepsis and acute hypoxic respiratory failure due to covid-19 infection: Felt to be at high risk of progression to ARDS based on current assessment. Comorbidities include advanced age, HTN, obesity. - Continue airborne, contact precautions. PPE including surgical gown, gloves, face shield, cap, shoe covers, and N-95 used during this encounter in a negative pressure room.  - Check daily labs: CBC w/diff, CMP, d-dimer, fibrinogen, ferritin, LDH, CRP. Stable elevations in inflammatory markers thus far. - Troponin is undetectable, BNP normal. Hep B SAg is negative.  - Enoxaparin prophylactic dose. If d-dimer >10, consider work up and 0.5mg /kg q12h intermediate dose.  - Blood cultures drawn, NGTD.  - Scheduled and prn MDI, no wheezing on exam.  - Prone as able, incentive spirometry. - Maintain euvolemia/net negative.  - Avoid NSAIDs -  Continue hydroxychloroquine 4/18 - 4/22. The use of this medication is off-label, and its efficacy in this clinical situation is not definitively proven. The risks, including death by cardiac arrhythmia as well as side effects of rash, LFT elevations, and rarely retinopathy, were discussed and the patient chooses to proceed. QTc is and will be monitored. - Goals of care were discussed, confirmed full code though patient will continue to consider this.  HTN:  - Hold losartan - Continue prn hydralazine. Normotensive.    Obesity: BMI 35.  - Noted.  Hyperlipidemia:  - Continue statin  DVT prophylaxis: Lovenox Code Status: Full Family Communication: Wife by phone today. Disposition Plan: Uncertain.  Consultants:   PCCM  Procedures:   None  Antimicrobials:  Hydroxychloroquine 4/18 - 4/22.   Subjective: Feels ill but wants to go home. After exam he agrees he needs to be in the hospital. Had fevers last night. No chest pain. Having significant shortness of breath and weakness, not worse from yesterday.  Objective: Vitals:   07/14/18 1100 07/14/18 1200 07/14/18 1300 07/14/18 1355  BP:  115/72    Pulse: 87 82 87 83  Resp: (!) 30 18 (!) 29 (!) 22  Temp:    98 F (36.7 C)  TempSrc:      SpO2: 96% 96% 94% 95%  Weight:      Height:        Intake/Output Summary (Last 24 hours) at 07/14/2018 1409 Last data filed at 07/14/2018 1149 Gross per 24 hour  Intake 2988.74 ml  Output 700 ml  Net 2288.74 ml   Filed Weights   07/12/18 1905 07/13/18 0121  Weight: 95.2 kg 84.2 kg    Gen: 73 y.o. male in no distress Pulm: Non-labored tachypnea with supplemental O2 at 3L. Clear to auscultation bilaterally.  CV: Regular rate and rhythm. No murmur, rub, or gallop. No JVD, no pedal edema. GI: Abdomen soft, non-tender, non-distended, with normoactive bowel sounds. No organomegaly or masses felt. Ext: Warm, no deformities Skin: No rashes, lesions or ulcers Neuro: Alert and  oriented. Diffusely weak, unable to sit unassisted, needed to be slid up in bed. No focal neurological deficits. Psych: Judgement and insight appear fair. Mood depressed & affect appropriate.   Data Reviewed: I have personally reviewed following labs and imaging studies  CBC: Recent Labs  Lab 07/12/18 2003 07/13/18 0140 07/14/18 1000  WBC 9.4 8.9 7.9  NEUTROABS 8.5* 7.8* 6.6  HGB 14.4 14.4  14.5 13.2  HCT 42.1 42.5 39.1  MCV 93.3 94.0 94.9  PLT 221 222 230   Basic Metabolic Panel: Recent Labs  Lab 07/12/18 2003 07/13/18 0140 07/14/18 1000  NA 133* 134* 136  K 4.0 4.1 4.3  CL 100 101 102  CO2 23 23 26   GLUCOSE 122* 119* 110*  BUN 24* 24* 20  CREATININE 0.87 0.87 0.73  CALCIUM 8.6* 8.7* 8.3*  MG  --   --  2.6*   GFR: Estimated Creatinine Clearance: 78.6 mL/min (by C-G formula based on SCr of 0.73 mg/dL). Liver Function Tests: Recent Labs  Lab 07/12/18 2003 07/13/18 0140 07/14/18 1000  AST 57* 59* 58*  ALT 38 39 40  ALKPHOS 35* 34* 35*  BILITOT 1.1 1.2 1.1  PROT 7.2 7.3 6.5  ALBUMIN 3.3* 3.3* 2.8*   No results for input(s): LIPASE, AMYLASE in the last 168 hours. No results for input(s): AMMONIA in the last 168 hours. Coagulation Profile: No results for input(s): INR, PROTIME in the last 168 hours. Cardiac Enzymes: Recent Labs  Lab 07/13/18 0140 07/14/18 1000  CKTOTAL 251 132  TROPONINI <0.03  --    BNP (last 3 results) No results for input(s): PROBNP in the last 8760 hours. HbA1C: No results for input(s): HGBA1C in the last 72 hours. CBG: No results for input(s): GLUCAP in the last 168 hours. Lipid Profile: Recent Labs    07/12/18 2003  TRIG 102   Thyroid Function Tests: No results for input(s): TSH, T4TOTAL, FREET4, T3FREE, THYROIDAB in the last 72 hours. Anemia Panel: Recent Labs    07/12/18 2003 07/14/18 1000  FERRITIN 1,247* 1,323*   Urine analysis:    Component Value Date/Time   COLORURINE YELLOW 07/05/2018 2347   APPEARANCEUR  CLEAR 07/05/2018 2347   LABSPEC 1.025 07/05/2018 2347   PHURINE 6.0 07/05/2018 2347   GLUCOSEU NEGATIVE 07/05/2018 2347   HGBUR NEGATIVE 07/05/2018 2347   BILIRUBINUR NEGATIVE 07/05/2018 2347   KETONESUR NEGATIVE 07/05/2018 2347   PROTEINUR NEGATIVE 07/05/2018 2347   NITRITE NEGATIVE 07/05/2018 2347   LEUKOCYTESUR NEGATIVE 07/05/2018 2347   Recent Results (from the past 240 hour(s))  SARS Coronavirus 2 [facility](Hospital order, Performed in [facility] lab)     Status: Abnormal   Collection Time: 07/12/18  6:39 PM  Result Value Ref Range Status   SARS Coronavirus 2 POSITIVE (A) NEGATIVE Final    Comment: RESULT CALLED TO, READ BACK BY AND VERIFIED WITH: FARRAR,S RN @2021  ON 07/12/2018 JACKSON,K (NOTE) If result is NEGATIVE SARS-CoV-2 target nucleic acids are NOT DETECTED. The SARS-CoV-2 RNA is generally detectable in upper and lower  respiratory specimens during the acute phase of infection. The lowest  concentration of SARS-CoV-2 viral copies this assay can detect is 250  copies / mL. A negative result does not preclude SARS-CoV-2 infection  and should not be used as the sole basis for treatment or other  patient management decisions.  A negative result may occur with  improper specimen collection / handling, submission of specimen other  than nasopharyngeal swab, presence of viral mutation(s) within the  areas targeted by this assay, and inadequate number of viral copies  (<250 copies / mL). A negative result must be combined with clinical  observations, patient history, and epidemiological information. If result is POSITIVE SARS-CoV-2 target nucleic acids are DETECTE D. The SARS-CoV-2 RNA is generally detectable in upper and lower  respiratory specimens during the acute phase of infection.  Positive  results are indicative of active infection with SARS-CoV-2.  Clinical  correlation with patient history and other diagnostic information is  necessary to determine patient  infection status.  Positive results do  not rule out bacterial infection or co-infection with other viruses. If result is PRESUMPTIVE POSTIVE SARS-CoV-2 nucleic acids MAY BE PRESENT.   A presumptive positive result was obtained on the submitted specimen  and confirmed on repeat testing.  While 2019 novel coronavirus  (SARS-CoV-2) nucleic acids may be present in the submitted sample  additional confirmatory testing may be necessary for epidemiological  and / or clinical management purposes  to differentiate between  SARS-CoV-2 and other Sarbecovirus currently known to infect humans.  If clinically indicated additional testing with an alternate test  methodology (LAB745 3) is advised. The SARS-CoV-2 RNA is generally  detectable in upper and lower respiratory specimens during the acute  phase of infection. The expected result is Negative. Fact Sheet for Patients:  BoilerBrush.com.cy Fact Sheet for Healthcare Providers: https://pope.com/ This test is not yet approved or cleared by the [place] FDA and has been authorized for detection and/or diagnosis of SARS-CoV-2 by FDA under an Emergency Use Authorization (EUA).  This EUA will remain in effect (meaning this test can be used) for the duration of the COVID-19 declaration under Section 564(b)(1) of the Act, 21 U.S.C. section 360bbb-3(b)(1), unless the authorization is terminated or revoked sooner. Performed at [facility], [address] [place], [place] [address]   Blood Culture (routine x 2)     Status: None (Preliminary result)   Collection Time: 07/12/18  7:50 PM  Result Value Ref Range Status   Specimen Description   Final    BLOOD LEFT HAND Performed at [facility], [address] [place], [place] [address]    Special Requests   Final    BOTTLES DRAWN AEROBIC AND ANAEROBIC Blood Culture results may not be optimal due to an excessive  volume of blood received in culture bottles Performed at [facility], [address], [place], [place] [address]    Culture   Final    NO GROWTH 2 DAYS Performed at [facility], [address] [place], [place] [address]    Report Status PENDING  Incomplete  Blood Culture (routine x 2)     Status: None (Preliminary result)   Collection Time: 07/12/18  8:03 PM  Result Value Ref Range Status   Specimen Description   Final    BLOOD RIGHT HAND Performed at [facility], [address], [place], [place] [address]    Special Requests   Final    BOTTLES DRAWN AEROBIC AND ANAEROBIC Blood Culture adequate volume Performed at  [facility], [address], [place], [place] [address]    Culture   Final    NO GROWTH 2 DAYS Performed at [facility], [address] [place], [place] [address]    Report Status PENDING  Incomplete  Respiratory Panel by PCR     Status: None   Collection Time: 07/12/18  9:02 PM  Result Value Ref Range Status   Adenovirus NOT DETECTED NOT DETECTED Final   Coronavirus 229E NOT DETECTED NOT DETECTED Final    Comment: (NOTE) The Coronavirus on the Respiratory Panel, DOES NOT test for the novel  Coronavirus (2019 nCoV)    Coronavirus HKU1 NOT DETECTED NOT DETECTED Final   Coronavirus NL63 NOT DETECTED NOT DETECTED Final   Coronavirus OC43 NOT DETECTED NOT DETECTED Final   Metapneumovirus NOT DETECTED NOT DETECTED Final   Rhinovirus / Enterovirus NOT DETECTED NOT DETECTED Final   Influenza A NOT DETECTED NOT DETECTED Final   Influenza B NOT DETECTED NOT DETECTED Final   Parainfluenza Virus 1 NOT DETECTED NOT DETECTED Final   Parainfluenza Virus 2 NOT DETECTED NOT DETECTED Final   Parainfluenza Virus 3 NOT DETECTED NOT DETECTED Final   Parainfluenza Virus 4 NOT DETECTED NOT DETECTED Final   Respiratory Syncytial Virus NOT DETECTED NOT DETECTED Final   Bordetella pertussis NOT DETECTED NOT  DETECTED Final   Chlamydophila pneumoniae NOT DETECTED NOT DETECTED Final   Mycoplasma pneumoniae NOT DETECTED NOT DETECTED Final    Comment: Performed at [facility], [address] [place], [place] [address]      Radiology Studies: Dg Chest Port 1 View  Result Date: 07/12/2018 CLINICAL DATA:  Cough, fever EXAM: PORTABLE CHEST 1 VIEW COMPARISON:  07/05/2018 FINDINGS: The heart size and mediastinal contours are within normal limits. Both lungs are clear. The visualized skeletal structures are unremarkable. IMPRESSION: No active disease. Electronically Signed   By: Elige Ko   On: 07/12/2018 19:43    Scheduled Meds: . albuterol  2 puff Inhalation BID  . enoxaparin (LOVENOX) injection  40 mg Subcutaneous Daily  . hydroxychloroquine  200 mg Oral BID  . mouth rinse  15 mL Mouth Rinse BID  . PARoxetine  20 mg Oral Daily  . simvastatin  10 mg Oral q1800   Continuous Infusions:   LOS: 2 days   Time spent: 25 minutes.  Tyrone Nine, MD [facility] [email or web address] Password [email or web address] 07/14/2018, 2:09 PM

## 2018-07-14 NOTE — Progress Notes (Addendum)
Temp rechecked at 97.2, ice packs removed, also bladder scanned for 625, stood patient at the bedside to try and void, pt states that he feels like he has to go but cannot start going,  preformed in and out cath per order, which returned 700 cc's of clear amber urine, patient also with complaints of dry eyes and mucus membranes, order for eye drops and saline nasal spray placed and nose also moisturized with medline mouth gel.

## 2018-07-14 NOTE — Plan of Care (Signed)

## 2018-07-14 NOTE — Progress Notes (Signed)
Pt unable to void foley inserted per Dr Jarvis Newcomer order with 350 cc urine back D Susann Givens RN

## 2018-07-14 NOTE — Progress Notes (Signed)
Patient w/ temp of 101.5, po tylenol given and ice packs applied under arms and behind neck, rest of vitals stable, turned o2 down to 3 liters, sats at 94%

## 2018-07-15 LAB — PATHOLOGIST SMEAR REVIEW

## 2018-07-15 LAB — LEGIONELLA PNEUMOPHILA SEROGP 1 UR AG: L. pneumophila Serogp 1 Ur Ag: NEGATIVE

## 2018-07-15 LAB — CBC WITH DIFFERENTIAL/PLATELET
Abs Immature Granulocytes: 0.2 10*3/uL — ABNORMAL HIGH (ref 0.00–0.07)
Band Neutrophils: 8 %
Basophils Absolute: 0 10*3/uL (ref 0.0–0.1)
Basophils Relative: 0 %
Eosinophils Absolute: 0.1 10*3/uL (ref 0.0–0.5)
Eosinophils Relative: 1 %
HCT: 38.8 % — ABNORMAL LOW (ref 39.0–52.0)
Hemoglobin: 12.8 g/dL — ABNORMAL LOW (ref 13.0–17.0)
Lymphocytes Relative: 6 %
Lymphs Abs: 0.5 10*3/uL — ABNORMAL LOW (ref 0.7–4.0)
MCH: 31.2 pg (ref 26.0–34.0)
MCHC: 33 g/dL (ref 30.0–36.0)
MCV: 94.6 fL (ref 80.0–100.0)
Metamyelocytes Relative: 1 %
Monocytes Absolute: 0.6 10*3/uL (ref 0.1–1.0)
Monocytes Relative: 8 %
Myelocytes: 1 %
Neutro Abs: 6.3 10*3/uL (ref 1.7–7.7)
Neutrophils Relative %: 75 %
Platelets: 264 10*3/uL (ref 150–400)
RBC: 4.1 MIL/uL — ABNORMAL LOW (ref 4.22–5.81)
RDW: 12.2 % (ref 11.5–15.5)
WBC: 7.6 10*3/uL (ref 4.0–10.5)
nRBC: 0 % (ref 0.0–0.2)

## 2018-07-15 LAB — C-REACTIVE PROTEIN: CRP: 4.1 mg/dL — ABNORMAL HIGH (ref <1.0)

## 2018-07-15 LAB — COMPREHENSIVE METABOLIC PANEL
ALT: 41 U/L (ref 0–44)
AST: 53 U/L — ABNORMAL HIGH (ref 15–41)
Albumin: 2.7 g/dL — ABNORMAL LOW (ref 3.5–5.0)
Alkaline Phosphatase: 34 U/L — ABNORMAL LOW (ref 38–126)
Anion gap: 8 (ref 5–15)
BUN: 17 mg/dL (ref 8–23)
CO2: 26 mmol/L (ref 22–32)
Calcium: 8.4 mg/dL — ABNORMAL LOW (ref 8.9–10.3)
Chloride: 101 mmol/L (ref 98–111)
Creatinine, Ser: 0.76 mg/dL (ref 0.61–1.24)
GFR calc Af Amer: >60 mL/min (ref >60)
GFR calc non Af Amer: >60 mL/min (ref >60)
Glucose, Bld: 99 mg/dL (ref 70–99)
Potassium: 4.4 mmol/L (ref 3.5–5.1)
Sodium: 135 mmol/L (ref 135–145)
Total Bilirubin: 1 mg/dL (ref 0.3–1.2)
Total Protein: 6.4 g/dL — ABNORMAL LOW (ref 6.5–8.1)

## 2018-07-15 LAB — MAGNESIUM: Magnesium: 2.5 mg/dL — ABNORMAL HIGH (ref 1.7–2.4)

## 2018-07-15 LAB — ABO/RH: ABO/RH(D): A POS

## 2018-07-15 LAB — FIBRINOGEN: Fibrinogen: 667 mg/dL — ABNORMAL HIGH (ref 210–475)

## 2018-07-15 LAB — D-DIMER, QUANTITATIVE: D-Dimer, Quant: 1.5 ug/mL-FEU — ABNORMAL HIGH (ref 0.00–0.50)

## 2018-07-15 LAB — FERRITIN: Ferritin: 1340 ng/mL — ABNORMAL HIGH (ref 24–336)

## 2018-07-15 LAB — CK: Total CK: 75 U/L (ref 49–397)

## 2018-07-15 NOTE — Progress Notes (Signed)
Patient wife updated per phone, patient facetimed with wife.

## 2018-07-15 NOTE — Progress Notes (Signed)
PROGRESS NOTE  Brent Ho ZOX:[id number] DOB: Jan 29, 1946 DOA: 07/12/2018 PCP: Angelica ChessmanAguiar, Rafaela M, MD   LOS: 3 days   Brief Narrative / Interim history: 73 year old male with history of hypertension, hyperlipidemia, anxiety/depression who was initially evaluated at [facility] on 4/10, diagnosed with CAP and treated with antibiotics, he was discharged home, took antibiotics for 5 days but had no improvement in the symptoms, came back on 4/17, he was found to be hypoxic, tested positive for Covid as well as had lymphopenia, elevated inflammatory markers, and was admitted to the hospital.  He was initially at [facility] and transferred to [facility] on 4/18 on 3 L supplemental oxygen  Approximate date Covid symptoms started: 4/8.  Today day 12  Subjective: He is feeling about the same, overall better than when he came in but still short of breath.  He denies any chest pain, denies any abdominal pain, no nausea or vomiting.  Assessment & Plan: Principal Problem:   COVID-19 virus infection Active Problems:   Hypercholesteremia   Hypertension   Sepsis (HCC)   Acute respiratory failure with hypoxia (HCC)   Depression   Principal Problem Acute Hypoxic Resp. Failure due to Acute Covid 19 Viral Illness during the ongoing 2020 Covid 19 Pandemic  -Patient remained clinically stable, he was placed on hydroxychloroquine on 4/18 and is to continue until 4/22.  The use of this medication is off-label, and its efficacy in this clinical situation is not definitively proven. The risks, including death by cardiac arrhythmia as well as side effects of rash, LFT elevations, and rarely retinopathy, were discussed and the patient chooses to proceed -Febrile  Treatment so far -Hydroxychloroquine 4/18 >>.  QTc 443 today, continue to monitor  COVID-19 markers -Ferritin 1323-1340 -CRP 3.7-4.1 -D-dimer 2.6-2.1-1.5 -Fibrinogen [id number]  Oxygenation requirements -3 L nasal cannula  Fluid status -Appears euvolemic   Active Problems HTN - Hold losartan - Continue prn hydralazine. Normotensive.    Obesity: BMI 35.  - Noted  Hyperlipidemia - Continue statin  Scheduled Meds: . albuterol  2 puff Inhalation BID  . enoxaparin (LOVENOX) injection  40 mg Subcutaneous Daily  . hydroxychloroquine  200 mg Oral BID  . mouth rinse  15 mL Mouth Rinse BID  . PARoxetine  20 mg Oral Daily  . simvastatin  10 mg Oral q1800   Continuous Infusions: PRN Meds:.acetaminophen, albuterol, dextromethorphan-guaiFENesin, hydrALAZINE, ondansetron **OR** ondansetron (ZOFRAN) IV, polyvinyl alcohol, senna-docusate, sodium chloride  DVT prophylaxis: Lovenox Code Status: Full code Family Communication: wife by phone, Sallyanne HaversBonnie Sabino, [phone] Disposition Plan: TBD  Consultants:   None   Procedures:   None   Antimicrobials:  Hydroxychloroquine 4/18 >>   Objective: Vitals:   07/15/18 1000 07/15/18 1010 07/15/18 1012 07/15/18 1023  BP: 113/71 114/76 102/67 133/75  Pulse: 78 92 85 82  Resp: (!) 24 18 (!) 24 (!) 25  Temp:      TempSrc:      SpO2: 94% 91% 90% 90%  Weight:      Height:        Intake/Output Summary (Last 24 hours) at 07/15/2018 1149 Last data filed at 07/15/2018 0912 Gross per 24 hour  Intake 480 ml  Output 850 ml  Net -370 ml   Filed Weights   07/12/18 1905 07/13/18 0121  Weight: 95.2 kg 84.2 kg    Examination:  Constitutional: NAD appears to be breathing a little bit hard Eyes: PERRL, lids and conjunctivae normal ENMT: Mucous membranes are moist.  Neck: normal, supple, no masses,  no thyromegaly Respiratory: clear to auscultation bilaterally, no wheezing, no crackles. Normal respiratory effort. N Cardiovascular: Regular rate and rhythm, no murmurs / rubs / gallops. No LE edema.  Abdomen: no tenderness. Bowel sounds positive.  Musculoskeletal: no clubbing / cyanosis.  Skin: no rashes Neurologic: CN 2-12 grossly intact. Strength 5/5 in all 4.  Psychiatric: Normal judgment and  insight. Alert and oriented x 3. Normal mood.    Data Reviewed: I have independently reviewed following labs and imaging studies   CBC: Recent Labs  Lab 07/12/18 2003 07/13/18 0140 07/14/18 1000 07/15/18 0530  WBC 9.4 8.9 7.9 7.6  NEUTROABS 8.5* 7.8* 6.6 6.3  HGB 14.4 14.4  14.5 13.2 12.8*  HCT 42.1 42.5 39.1 38.8*  MCV 93.3 94.0 94.9 94.6  PLT 221 222 230 264   Basic Metabolic Panel: Recent Labs  Lab 07/12/18 2003 07/13/18 0140 07/14/18 1000 07/15/18 0530  NA 133* 134* 136 135  K 4.0 4.1 4.3 4.4  CL 100 101 102 101  CO2 GLUCOSE 122* 119* 110* 99  BUN 24* 24* 20 17  CREATININE 0.87 0.87 0.73 0.76  CALCIUM 8.6* 8.7* 8.3* 8.4*  MG  --   --  2.6* 2.5*   GFR: Estimated Creatinine Clearance: 78.6 mL/min (by C-G formula based on SCr of 0.76 mg/dL). Liver Function Tests: Recent Labs  Lab 07/12/18 2003 07/13/18 0140 07/14/18 1000 07/15/18 0530  AST 57* 59* 58* 53*  ALT 38 39 40 41  ALKPHOS 35* 34* 35* 34*  BILITOT 1.1 1.2 1.1 1.0  PROT 7.2 7.3 6.5 6.4*  ALBUMIN 3.3* 3.3* 2.8* 2.7*   No results for input(s): LIPASE, AMYLASE in the last 168 hours. No results for input(s): AMMONIA in the last 168 hours. Coagulation Profile: No results for input(s): INR, PROTIME in the last 168 hours. Cardiac Enzymes: Recent Labs  Lab 07/13/18 0140 07/14/18 1000 07/15/18 0530  CKTOTAL 251 132 75  TROPONINI <0.03  --   --    BNP (last 3 results) No results for input(s): PROBNP in the last 8760 hours. HbA1C: No results for input(s): HGBA1C in the last 72 hours. CBG: No results for input(s): GLUCAP in the last 168 hours. Lipid Profile: Recent Labs    07/12/18 2003  TRIG 102   Thyroid Function Tests: No results for input(s): TSH, T4TOTAL, FREET4, T3FREE, THYROIDAB in the last 72 hours. Anemia Panel: Recent Labs    07/14/18 1000 07/15/18 0530  FERRITIN 1,323* 1,340*   Urine analysis:    Component Value Date/Time   COLORURINE YELLOW 07/05/2018 2347    APPEARANCEUR CLEAR 07/05/2018 2347   LABSPEC 1.025 07/05/2018 2347   PHURINE 6.0 07/05/2018 2347   GLUCOSEU NEGATIVE 07/05/2018 2347   HGBUR NEGATIVE 07/05/2018 2347   BILIRUBINUR NEGATIVE 07/05/2018 2347   KETONESUR NEGATIVE 07/05/2018 2347   PROTEINUR NEGATIVE 07/05/2018 2347   NITRITE NEGATIVE 07/05/2018 2347   LEUKOCYTESUR NEGATIVE 07/05/2018 2347   Sepsis Labs: Invalid input(s): PROCALCITONIN, LACTICIDVEN  Recent Results (from the past 240 hour(s))  SARS Coronavirus 2 [facility] order, Performed in [facility] hospital lab)     Status: Abnormal   Collection Time: 07/12/18  6:39 PM  Result Value Ref Range Status   SARS Coronavirus 2 POSITIVE (A) NEGATIVE Final    Comment: RESULT CALLED TO, READ BACK BY AND VERIFIED WITH: FARRAR,S RN  ON 07/12/2018 JACKSON,K (NOTE) If result is NEGATIVE SARS-CoV-2 target nucleic acids are NOT DETECTED. The SARS-CoV-2 RNA is generally detectable in upper and  lower  respiratory specimens during the acute phase of infection. The lowest  concentration of SARS-CoV-2 viral copies this assay can detect is 250  copies / mL. A negative result does not preclude SARS-CoV-2 infection  and should not be used as the sole basis for treatment or other  patient management decisions.  A negative result may occur with  improper specimen collection / handling, submission of specimen other  than nasopharyngeal swab, presence of viral mutation(s) within the  areas targeted by this assay, and inadequate number of viral copies  (<250 copies / mL). A negative result must be combined with clinical  observations, patient history, and epidemiological information. If result is POSITIVE SARS-CoV-2 target nucleic acids are DETECTE D. The SARS-CoV-2 RNA is generally detectable in upper and lower  respiratory specimens during the acute phase of infection.  Positive  results are indicative of active infection with SARS-CoV-2.  Clinical  correlation with patient  history and other diagnostic information is  necessary to determine patient infection status.  Positive results do  not rule out bacterial infection or co-infection with other viruses. If result is PRESUMPTIVE POSTIVE SARS-CoV-2 nucleic acids MAY BE PRESENT.   A presumptive positive result was obtained on the submitted specimen  and confirmed on repeat testing.  While 2019 novel coronavirus  (SARS-CoV-2) nucleic acids may be present in the submitted sample  additional confirmatory testing may be necessary for epidemiological  and / or clinical management purposes  to differentiate between  SARS-CoV-2 and other Sarbecovirus currently known to infect humans.  If clinically indicated additional testing with an alternate test  methodology (LAB745 3) is advised. The SARS-CoV-2 RNA is generally  detectable in upper and lower respiratory specimens during the acute  phase of infection. The expected result is Negative. Fact Sheet for Patients:  BoilerBrush.com.cy Fact Sheet for Healthcare Providers: https://pope.com/ This test is not yet approved or cleared by the [place] FDA and has been authorized for detection and/or diagnosis of SARS-CoV-2 by FDA under an Emergency Use Authorization (EUA).  This EUA will remain in effect (meaning this test can be used) for the duration of the COVID-19 declaration under Section 564(b)(1) of the Act, 21 U.S.C. section 360bbb-3(b)(1), unless the authorization is terminated or revoked sooner. Performed at [facility], [address] [place], [place] [address]   Blood Culture (routine x 2)     Status: None (Preliminary result)   Collection Time: 07/12/18  7:50 PM  Result Value Ref Range Status   Specimen Description   Final    BLOOD LEFT HAND Performed at [facility], [address] [place], [place] [address]    Special Requests   Final    BOTTLES DRAWN AEROBIC  AND ANAEROBIC Blood Culture results may not be optimal due to an excessive volume of blood received in culture bottles Performed at [facility], [address] [place], [place] [address]    Culture   Final    NO GROWTH 2 DAYS Performed at [facility], [address], [place], [place] [address]    Report Status PENDING  Incomplete  Blood Culture (routine x 2)     Status: None (Preliminary result)   Collection Time: 07/12/18  8:03 PM  Result Value Ref Range Status   Specimen Description   Final    BLOOD RIGHT HAND Performed at [facility], [address], [place], [place] [address]    Special Requests   Final  BOTTLES DRAWN AEROBIC AND ANAEROBIC Blood Culture adequate volume Performed at [facility], [address] [place], [place] [address]    Culture   Final    NO GROWTH 2 DAYS Performed at [facility], [address], [place], [place] [address]    Report Status PENDING  Incomplete  Respiratory Panel by PCR     Status: None   Collection Time: 07/12/18  9:02 PM  Result Value Ref Range Status   Adenovirus NOT DETECTED NOT DETECTED Final   Coronavirus 229E NOT DETECTED NOT DETECTED Final    Comment: (NOTE) The Coronavirus on the Respiratory Panel, DOES NOT test for the novel  Coronavirus (2019 nCoV)    Coronavirus HKU1 NOT DETECTED NOT DETECTED Final   Coronavirus NL63 NOT DETECTED NOT DETECTED Final   Coronavirus OC43 NOT DETECTED NOT DETECTED Final   Metapneumovirus NOT DETECTED NOT DETECTED Final   Rhinovirus / Enterovirus NOT DETECTED NOT DETECTED Final   Influenza A NOT DETECTED NOT DETECTED Final   Influenza B NOT DETECTED NOT DETECTED Final   Parainfluenza Virus 1 NOT DETECTED NOT DETECTED Final   Parainfluenza Virus 2 NOT DETECTED NOT DETECTED Final   Parainfluenza Virus 3 NOT DETECTED NOT DETECTED Final   Parainfluenza Virus 4 NOT DETECTED NOT DETECTED Final   Respiratory Syncytial Virus  NOT DETECTED NOT DETECTED Final   Bordetella pertussis NOT DETECTED NOT DETECTED Final   Chlamydophila pneumoniae NOT DETECTED NOT DETECTED Final   Mycoplasma pneumoniae NOT DETECTED NOT DETECTED Final    Comment: Performed at [facility], [address], [place], [place] [address]      Radiology Studies: No results found.  Pamella Pert, MD, PhD [facility]  Contact via  [email or web address]  [facility] Office Info P: [phone]  F: [phone]

## 2018-07-15 NOTE — Progress Notes (Signed)
Patient dangled EOB, stood EOB and ambulated a few steps before returning to laying position. O2 dipped to 87% while ambulating on 3L  and returned to 92% once lying on left side.  Pt c/o of some SOB with standing and stating "I'm a little hot now." Cool cloth placed on pt forehead, pt resting in bed at this time.

## 2018-07-16 ENCOUNTER — Inpatient Hospital Stay (HOSPITAL_COMMUNITY): Payer: 59

## 2018-07-16 LAB — CBC WITH DIFFERENTIAL/PLATELET
Abs Immature Granulocytes: 0.22 10*3/uL — ABNORMAL HIGH (ref 0.00–0.07)
Basophils Absolute: 0 10*3/uL (ref 0.0–0.1)
Basophils Relative: 0 %
Eosinophils Absolute: 0.2 10*3/uL (ref 0.0–0.5)
Eosinophils Relative: 3 %
HCT: 38.2 % — ABNORMAL LOW (ref 39.0–52.0)
Hemoglobin: 12.7 g/dL — ABNORMAL LOW (ref 13.0–17.0)
Immature Granulocytes: 3 %
Lymphocytes Relative: 14 %
Lymphs Abs: 1.1 10*3/uL (ref 0.7–4.0)
MCH: 31.4 pg (ref 26.0–34.0)
MCHC: 33.2 g/dL (ref 30.0–36.0)
MCV: 94.6 fL (ref 80.0–100.0)
Monocytes Absolute: 0.3 10*3/uL (ref 0.1–1.0)
Monocytes Relative: 4 %
Neutro Abs: 5.8 10*3/uL (ref 1.7–7.7)
Neutrophils Relative %: 76 %
Platelets: 273 10*3/uL (ref 150–400)
RBC: 4.04 MIL/uL — ABNORMAL LOW (ref 4.22–5.81)
RDW: 11.9 % (ref 11.5–15.5)
WBC: 7.6 10*3/uL (ref 4.0–10.5)
nRBC: 0 % (ref 0.0–0.2)

## 2018-07-16 LAB — COMPREHENSIVE METABOLIC PANEL
ALT: 64 U/L — ABNORMAL HIGH (ref 0–44)
AST: 82 U/L — ABNORMAL HIGH (ref 15–41)
Albumin: 2.7 g/dL — ABNORMAL LOW (ref 3.5–5.0)
Alkaline Phosphatase: 41 U/L (ref 38–126)
Anion gap: 8 (ref 5–15)
BUN: 16 mg/dL (ref 8–23)
CO2: 26 mmol/L (ref 22–32)
Calcium: 8.4 mg/dL — ABNORMAL LOW (ref 8.9–10.3)
Chloride: 100 mmol/L (ref 98–111)
Creatinine, Ser: 0.77 mg/dL (ref 0.61–1.24)
GFR calc Af Amer: >60 mL/min (ref >60)
GFR calc non Af Amer: >60 mL/min (ref >60)
Glucose, Bld: 108 mg/dL — ABNORMAL HIGH (ref 70–99)
Potassium: 4.3 mmol/L (ref 3.5–5.1)
Sodium: 134 mmol/L — ABNORMAL LOW (ref 135–145)
Total Bilirubin: 0.9 mg/dL (ref 0.3–1.2)
Total Protein: 6.6 g/dL (ref 6.5–8.1)

## 2018-07-16 LAB — C-REACTIVE PROTEIN: CRP: 6.3 mg/dL — ABNORMAL HIGH (ref <1.0)

## 2018-07-16 LAB — MAGNESIUM: Magnesium: 2.4 mg/dL (ref 1.7–2.4)

## 2018-07-16 LAB — CK: Total CK: 63 U/L (ref 49–397)

## 2018-07-16 LAB — FERRITIN: Ferritin: 1478 ng/mL — ABNORMAL HIGH (ref 24–336)

## 2018-07-16 LAB — FIBRINOGEN: Fibrinogen: 788 mg/dL — ABNORMAL HIGH (ref 210–475)

## 2018-07-16 LAB — D-DIMER, QUANTITATIVE: D-Dimer, Quant: 1.3 ug/mL-FEU — ABNORMAL HIGH (ref 0.00–0.50)

## 2018-07-16 MED ORDER — HYDROCODONE-HOMATROPINE 5-1.5 MG/5ML PO SYRP
5.0000 mL | ORAL_SOLUTION | Freq: Four times a day (QID) | ORAL | Status: DC | PRN
  Administered 2018-07-17: 08:00:00 5 mL via ORAL
  Filled 2018-07-16: qty 5

## 2018-07-16 NOTE — Evaluation (Signed)
Physical Therapy Evaluation Patient Details Name: Brent Ho Kines MRN: [id number] DOB: 1945-07-23 Today's Date: 07/16/2018   History of Present Illness  73 y.o. male admitted on 07/05/18 with fever.  Dx with acute hypoxic respiratory failure due to COVID 19.  [facility] on 3 L O2 [place].  He has not been intubated.  Pt with significant PMH of HTN and anxiety.  Clinical Impression  Pt is weak, with low activity tolerance due to respiratory compromise.  He was only able to walk 10' with support and desated to 85% on 3 L O2 [place] with DOE 3/4, RR in the low 30s, HR 100.  It took him > 6 mins to recover from walking.   Pt has a wife at home who he reports can only provide supervision and not true physical assist.  He will need to be able to tolerate more activity with more stable vitals before he can d/c home.  PT to follow acutely for deficits listed below.      Follow Up Recommendations Home health PT;Supervision for mobility/OOB(he will need to tolerate more activity prior to d/c)    Equipment Recommendations  Rolling walker with 5" wheels;3in1 (PT);Other (comment)(possible home O2?)    Recommendations for Other Services   NA    Precautions / Restrictions Precautions Precautions: Other (comment) Precaution Comments: desats quickly, monitor closely      Mobility  Bed Mobility Overal bed mobility: Needs Assistance Bed Mobility: Supine to Sit;Sit to Supine     Supine to sit: Supervision;HOB elevated Sit to supine: Supervision;HOB elevated   General bed mobility comments: Supervision for safety, significant extra time needed and reliance on bed rails.   Transfers Overall transfer level: Needs assistance Equipment used: (back of recliner chair) Transfers: Sit to/from Stand Sit to Stand: Min assist         General transfer comment: Min assist to support trunk during transitions.   Ambulation/Gait Ambulation/Gait assistance: Min assist Gait Distance (Feet): 10  Feet Assistive device: (back of the recliner chair with breaks unlocked) Gait Pattern/deviations: Step-through pattern;Shuffle     General Gait Details: Pt with slow, shakey gait pattern, DOE immediate upon attempting to ambulate 3/4, O2 sats dropped to 85% on 3 L O2 [place] and took >6 mins to recover to 89-90%.  Pt with dry coughing spells once mobile as well.  Attempted to get him to purse lip breathe, but he reports nasal conjestion.           Balance Overall balance assessment: Needs assistance Sitting-balance support: Feet supported;Bilateral upper extremity supported Sitting balance-Leahy Scale: Fair     Standing balance support: Bilateral upper extremity supported Standing balance-Leahy Scale: Poor Standing balance comment: needs external support in standing.                              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      Hand Dominance   Dominant Hand: Right  Extremity/Trunk Assessment   Upper Extremity Assessment Upper Extremity Assessment: Defer to OT evaluation    Lower Extremity Assessment Lower Extremity Assessment: Generalized weakness(at least 3/5 per gross bed level assessment)    Cervical / Trunk Assessment Cervical / Trunk Assessment: Normal  Communication   Communication: HOH  Cognition Arousal/Alertness: Awake/alert Behavior During Therapy: WFL for tasks  assessed/performed Overall Cognitive Status: Within Functional Limits for tasks assessed                                 General Comments: A and O x 4             Assessment/Plan    PT Assessment Patient needs continued PT services  PT Problem List Decreased strength;Decreased activity tolerance;Decreased balance;Decreased mobility;Decreased knowledge of use of DME;Decreased knowledge of precautions;Cardiopulmonary status limiting activity       PT Treatment Interventions DME instruction;Gait training;Functional mobility training;Stair training;Therapeutic activities;Therapeutic exercise;Balance training;Patient/family education    PT Goals (Current goals can be found in the Care Plan section)  Acute Rehab PT Goals Patient Stated Goal: to go home PT Goal Formulation: With patient Time For Goal Achievement: 07/30/18 Potential to Achieve Goals: Good    Frequency Min 5X/week   Barriers to discharge Decreased caregiver support wife can only provide supervision       AM-PAC PT "6 Clicks" Mobility  Outcome Measure Help needed turning from your back to your side while in a flat bed without using bedrails?: A Little Help needed moving from lying on your back to sitting on the side of a flat bed without using bedrails?: A Little Help needed moving to and from a bed to a chair (including a wheelchair)?: A Little Help needed standing up from a chair using your arms (e.g., wheelchair or bedside chair)?: A Little Help needed to walk in hospital room?: A Little Help needed climbing 3-5 steps with a railing? : A Lot 6 Click Score: 17    End of Session Equipment Utilized During Treatment: Gait belt;Oxygen(3 L O2 [place], portable telemetry monitor. ) Activity Tolerance: Patient limited by fatigue;Other (comment)(limited by DOE/low sats) Patient left: in bed;with call bell/phone within reach Nurse Communication: Mobility status PT Visit Diagnosis: Muscle weakness  (generalized) (M62.81);Difficulty in walking, not elsewhere classified (R26.2)    Time: 1330-1400 PT Time Calculation (min) (ACUTE ONLY): 30 min   Charges:           Lurena Joiner B. Brainard Highfill, PT, DPT  Acute Rehabilitation [phone] pager #[phone] office   PT Evaluation $PT Eval Moderate Complexity: 1 Mod PT Treatments $Gait Training: 8-22 mins        07/16/2018, 2:21 PM

## 2018-07-16 NOTE — Progress Notes (Signed)
Patient educated on the importance of laying prone to help with breathing and lung expansion. Patient unable to lay prone at this time. Turned Patient to the left side. Will attempt prone positioning later

## 2018-07-16 NOTE — Progress Notes (Signed)
PROGRESS NOTE  Helyn NumbersReid Tregoning ONG:[id number] DOB: 11-20-1945 DOA: 07/12/2018 PCP: Angelica ChessmanAguiar, Rafaela M, MD   LOS: 4 days   Brief Narrative / Interim history: 73 year old male with history of hypertension, hyperlipidemia, anxiety/depression who was initially evaluated at [facility] on 4/10, diagnosed with CAP and treated with antibiotics, he was discharged home, took antibiotics for 5 days but had no improvement in the symptoms, came back on 4/17, he was found to be hypoxic, tested positive for Covid as well as had lymphopenia, elevated inflammatory markers, and was admitted to the hospital.  He was initially at [facility] and transferred to [facility] on 4/18 on 3 L supplemental oxygen  Approximate date Covid symptoms started: 4/8.  Today day 13  Subjective: He is breathing comfortably this morning, denies any worsening shortness of breath.  Reports fatigue and tiredness with ambulating in the room.  Denies any chest pain.  Denies any abdominal pain, no nausea or vomiting.  Assessment & Plan: Principal Problem:   COVID-19 virus infection Active Problems:   Hypercholesteremia   Hypertension   Sepsis (HCC)   Acute respiratory failure with hypoxia (HCC)   Depression   Principal Problem Acute Hypoxic Resp. Failure due to Acute Covid 19 Viral Illness during the ongoing 2020 Covid 19 Pandemic  -He continues to remain clinically stable on 3 L nasal cannula, he is on hydroxychloroquine which was started on 4/18 and is scheduled to be done on 4/22. The use of this medication is off-label, and its efficacy in this clinical situation is not definitively proven. The risks, including death by cardiac arrhythmia as well as side effects of rash, LFT elevations, and rarely retinopathy, were discussed and the patient chooses to proceed -Remains afebrile  Treatment so far -Hydroxychloroquine 4/18 >>  COVID-19 markers -Ferritin [id number] -CRP 3.7-4.1-6.3 -D-dimer 2.6-2.1-1.5-1.3 -Fibrinogen [phone]   Oxygenation requirements - stable on 3 L nasal cannula  Fluid status -Appears euvolemic  Active Problems HTN - Hold losartan - Continue prn hydralazine. Normotensive.    Obesity: BMI 35.  - Noted  Hyperlipidemia - Continue statin  Scheduled Meds: . albuterol  2 puff Inhalation BID  . enoxaparin (LOVENOX) injection  40 mg Subcutaneous Daily  . hydroxychloroquine  200 mg Oral BID  . mouth rinse  15 mL Mouth Rinse BID  . PARoxetine  20 mg Oral Daily  . simvastatin  10 mg Oral q1800   Continuous Infusions: PRN Meds:.acetaminophen, albuterol, dextromethorphan-guaiFENesin, hydrALAZINE, ondansetron **OR** ondansetron (ZOFRAN) IV, polyvinyl alcohol, senna-docusate, sodium chloride  DVT prophylaxis: Lovenox Code Status: Full code Family Communication: wife by phone, Sallyanne HaversBonnie Humphrey, [phone] Disposition Plan: TBD  Consultants:   None   Procedures:   None   Antimicrobials:  Hydroxychloroquine 4/18 >>   Objective: Vitals:   07/16/18 0610 07/16/18 0611 07/16/18 0612 07/16/18 0613  BP:      Pulse: 81 80 78 88  Resp: 15  12 17   Temp:      TempSrc:      SpO2: 92% 93% 93% 94%  Weight:      Height:        Intake/Output Summary (Last 24 hours) at 07/16/2018 0814 Last data filed at 07/16/2018 [id number] Gross per 24 hour  Intake 720 ml  Output 1000 ml  Net -280 ml   Filed Weights   07/12/18 1905 07/13/18 0121  Weight: 95.2 kg 84.2 kg    Examination:  Constitutional: No apparent distress, appears comfortable, eating breakfast Eyes: No scleral icterus ENMT: Moist mucous membranes Neck: Supple, no  JVD Respiratory: Diminished sounds at the bases, no crackles, no wheezing Cardiovascular: Regular rate and rhythm, no murmurs appreciated.  No peripheral edema Abdomen: Soft, nontender, nondistended, positive bowel sounds Musculoskeletal: no clubbing / cyanosis.  Skin: No rashes seen Neurologic: No focal deficits, equal strength Psychiatric: Alert and oriented x3    Data Reviewed: I have independently reviewed following labs and imaging studies   CBC: Recent Labs  Lab 07/12/18 2003 07/13/18 0140 07/14/18 1000 07/15/18 0530 07/16/18 0450  WBC 9.4 8.9 7.9 7.6 7.6  NEUTROABS 8.5* 7.8* 6.6 6.3 5.8  HGB 14.4 14.4  14.5 13.2 12.8* 12.7*  HCT 42.1 42.5 39.1 38.8* 38.2*  MCV 93.3 94.0 94.9 94.6 94.6  PLT 221 222 230 264 273   Basic Metabolic Panel: Recent Labs  Lab 07/12/18 2003 07/13/18 0140 07/14/18 1000 07/15/18 0530 07/16/18 0450  NA 133* 134* 136 135 134*  K 4.0 4.1 4.3 4.4 4.3  CL 100 101 102 101 100  CO2 GLUCOSE 122* 119* 110* 99 108*  BUN 24* 24* CREATININE 0.87 0.87 0.73 0.76 0.77  CALCIUM 8.6* 8.7* 8.3* 8.4* 8.4*  MG  --   --  2.6* 2.5* 2.4   GFR: Estimated Creatinine Clearance: 78.6 mL/min (by C-G formula based on SCr of 0.77 mg/dL). Liver Function Tests: Recent Labs  Lab 07/12/18 2003 07/13/18 0140 07/14/18 1000 07/15/18 0530 07/16/18 0450  AST 57* 59* 58* 53* 82*  ALT 38 39 40 41 64*  ALKPHOS 35* 34* 35* 34* 41  BILITOT 1.1 1.2 1.1 1.0 0.9  PROT 7.2 7.3 6.5 6.4* 6.6  ALBUMIN 3.3* 3.3* 2.8* 2.7* 2.7*   No results for input(s): LIPASE, AMYLASE in the last 168 hours. No results for input(s): AMMONIA in the last 168 hours. Coagulation Profile: No results for input(s): INR, PROTIME in the last 168 hours. Cardiac Enzymes: Recent Labs  Lab 07/13/18 0140 07/14/18 1000 07/15/18 0530 07/16/18 0450  CKTOTAL 251 132 75 63  TROPONINI <0.03  --   --   --    BNP (last 3 results) No results for input(s): PROBNP in the last 8760 hours. HbA1C: No results for input(s): HGBA1C in the last 72 hours. CBG: No results for input(s): GLUCAP in the last 168 hours. Lipid Profile: No results for input(s): CHOL, HDL, LDLCALC, TRIG, CHOLHDL, LDLDIRECT in the last 72 hours. Thyroid Function Tests: No results for input(s): TSH, T4TOTAL, FREET4, T3FREE, THYROIDAB in the last 72 hours. Anemia Panel:  Recent Labs    07/15/18 0530 07/16/18 0450  FERRITIN 1,340* 1,478*   Urine analysis:    Component Value Date/Time   COLORURINE YELLOW 07/05/2018 2347   APPEARANCEUR CLEAR 07/05/2018 2347   LABSPEC 1.025 07/05/2018 2347   PHURINE 6.0 07/05/2018 2347   GLUCOSEU NEGATIVE 07/05/2018 2347   HGBUR NEGATIVE 07/05/2018 2347   BILIRUBINUR NEGATIVE 07/05/2018 2347   KETONESUR NEGATIVE 07/05/2018 2347   PROTEINUR NEGATIVE 07/05/2018 2347   NITRITE NEGATIVE 07/05/2018 2347   LEUKOCYTESUR NEGATIVE 07/05/2018 2347   Sepsis Labs: Invalid input(s): PROCALCITONIN, LACTICIDVEN  Recent Results (from the past 240 hour(s))  SARS Coronavirus 2 [facility] order, Performed in [facility] hospital lab)     Status: Abnormal   Collection Time: 07/12/18  6:39 PM  Result Value Ref Range Status   SARS Coronavirus 2 POSITIVE (A) NEGATIVE Final    Comment: RESULT CALLED TO, READ BACK BY AND VERIFIED WITH: FARRAR,S RN  ON 07/12/2018 JACKSON,K (NOTE) If result is  NEGATIVE SARS-CoV-2 target nucleic acids are NOT DETECTED. The SARS-CoV-2 RNA is generally detectable in upper and lower  respiratory specimens during the acute phase of infection. The lowest  concentration of SARS-CoV-2 viral copies this assay can detect is 250  copies / mL. A negative result does not preclude SARS-CoV-2 infection  and should not be used as the sole basis for treatment or other  patient management decisions.  A negative result may occur with  improper specimen collection / handling, submission of specimen other  than nasopharyngeal swab, presence of viral mutation(s) within the  areas targeted by this assay, and inadequate number of viral copies  (<250 copies / mL). A negative result must be combined with clinical  observations, patient history, and epidemiological information. If result is POSITIVE SARS-CoV-2 target nucleic acids are DETECTE D. The SARS-CoV-2 RNA is generally detectable in upper and lower  respiratory  specimens during the acute phase of infection.  Positive  results are indicative of active infection with SARS-CoV-2.  Clinical  correlation with patient history and other diagnostic information is  necessary to determine patient infection status.  Positive results do  not rule out bacterial infection or co-infection with other viruses. If result is PRESUMPTIVE POSTIVE SARS-CoV-2 nucleic acids MAY BE PRESENT.   A presumptive positive result was obtained on the submitted specimen  and confirmed on repeat testing.  While 2019 novel coronavirus  (SARS-CoV-2) nucleic acids may be present in the submitted sample  additional confirmatory testing may be necessary for epidemiological  and / or clinical management purposes  to differentiate between  SARS-CoV-2 and other Sarbecovirus currently known to infect humans.  If clinically indicated additional testing with an alternate test  methodology (LAB745 3) is advised. The SARS-CoV-2 RNA is generally  detectable in upper and lower respiratory specimens during the acute  phase of infection. The expected result is Negative. Fact Sheet for Patients:  BoilerBrush.com.cy Fact Sheet for Healthcare Providers: https://pope.com/ This test is not yet approved or cleared by the [place] FDA and has been authorized for detection and/or diagnosis of SARS-CoV-2 by FDA under an Emergency Use Authorization (EUA).  This EUA will remain in effect (meaning this test can be used) for the duration of the COVID-19 declaration under Section 564(b)(1) of the Act, 21 U.S.C. section 360bbb-3(b)(1), unless the authorization is terminated or revoked sooner. Performed at [facility], [address], [place], [place] [address]   Blood Culture (routine x 2)     Status: None (Preliminary result)   Collection Time: 07/12/18  7:50 PM  Result Value Ref Range Status   Specimen Description   Final    BLOOD  LEFT HAND Performed at [facility], [address] [place], [place] [address]    Special Requests   Final    BOTTLES DRAWN AEROBIC AND ANAEROBIC Blood Culture results may not be optimal due to an excessive volume of blood received in culture bottles Performed at [facility], [address] [place], [place] [address]    Culture   Final    NO GROWTH 3 DAYS Performed at [facility], [address], [place], [place] [address]    Report Status PENDING  Incomplete  Blood Culture (routine x 2)     Status: None (Preliminary result)   Collection Time: 07/12/18  8:03 PM  Result Value Ref Range Status   Specimen Description   Final    BLOOD RIGHT HAND Performed at [facility], [address] [place], [place] [address]    Special Requests   Final    BOTTLES DRAWN AEROBIC AND ANAEROBIC Blood Culture adequate volume Performed at [facility], [address], [place], [place] [address]    Culture   Final    NO GROWTH 3 DAYS Performed at [facility], [address], [place], [place] [address]    Report Status PENDING  Incomplete  Respiratory Panel by PCR     Status: None   Collection Time: 07/12/18  9:02 PM  Result Value Ref Range Status   Adenovirus NOT DETECTED NOT DETECTED Final   Coronavirus 229E NOT DETECTED NOT DETECTED Final    Comment: (NOTE) The Coronavirus on the Respiratory Panel, DOES NOT test for the novel  Coronavirus (2019 nCoV)    Coronavirus HKU1 NOT DETECTED NOT DETECTED Final   Coronavirus NL63 NOT DETECTED NOT DETECTED Final   Coronavirus OC43 NOT DETECTED NOT DETECTED Final   Metapneumovirus NOT DETECTED NOT DETECTED Final   Rhinovirus / Enterovirus NOT DETECTED NOT DETECTED Final   Influenza A NOT DETECTED NOT DETECTED Final   Influenza B NOT DETECTED NOT DETECTED Final   Parainfluenza Virus 1 NOT DETECTED NOT DETECTED Final   Parainfluenza Virus 2 NOT DETECTED NOT  DETECTED Final   Parainfluenza Virus 3 NOT DETECTED NOT DETECTED Final   Parainfluenza Virus 4 NOT DETECTED NOT DETECTED Final   Respiratory Syncytial Virus NOT DETECTED NOT DETECTED Final   Bordetella pertussis NOT DETECTED NOT DETECTED Final   Chlamydophila pneumoniae NOT DETECTED NOT DETECTED Final   Mycoplasma pneumoniae NOT DETECTED NOT DETECTED Final    Comment: Performed at [facility], [address], [place], [place] [address]      Radiology Studies: No results found.  Pamella Pert, MD, PhD [facility]  Contact via  [email or web address]  [facility] Office Info P: [phone]  F: [phone]

## 2018-07-16 NOTE — Progress Notes (Signed)
Occupational Therapy Evaluation Patient Details Name: Brent Ho Damron MRN: [id number] DOB: [birth year] Today's Date: 07/16/2018    History of Present Illness 73 y.o. male admitted on 07/05/18 with fever.  Dx with acute hypoxic respiratory failure due to COVID 19.  [facility] on 3 L O2 [place].  He has not been intubated.  Pt with significant PMH of HTN and anxiety.   Clinical Impression   PTA, pt was independent with ADL and mobility and worked in Airline pilotsales at [employer] (currently laid off due to [employer]ov[employer] crisis). Minguard A with mobility and ADL @ RW level. Improved performance this pm with OT. Desats to 88 on 3L although rebounds in @ 1 min. Pt is "bothered by his cough". At this time recommend follow up with HHOT pending progress. Will follow acutely to facilitate safe DC home.     Follow Up Recommendations  Supervision - Intermittent;Home health OT(pending progress)    Equipment Recommendations  (RW)    Recommendations for Other Services       Precautions / Restrictions Precautions Precaution Comments: desats quickly, monitor closely      Mobility Bed Mobility - OOB in chair Transfers Overall transfer level: Needs assistance Equipment used: RW Transfers: Sit to/from Stand Sit to Stand: Min guard    vc for hand placement          Balance Overall balance assessment: Needs assistance Sitting-balance support: Feet supported;Bilateral upper extremity supported Sitting balance-Leahy Scale: Good     Standing balance support: Bilateral upper extremity supported Standing balance-Leahy Scale: Fair Standing balance comment: better with RW                           ADL either performed or assessed with clinical judgement   ADL Overall ADL's : Needs assistance/impaired Eating/Feeding: Independent   Grooming: Set up;Sitting   Upper Body Bathing: Set up;Sitting   Lower Body Bathing: Min guard;Sit to/from stand   Upper Body Dressing : Set  up;Sitting   Lower Body Dressing: Min guard;Sit to/from stand   Toilet Transfer: Min guard;RW   Toileting- ArchitectClothing Manipulation and Hygiene: Supervision/safety       Functional mobility during ADLs: Min guard General ADL Comments: Fatigues easily     Vision Baseline Vision/History: Wears glasses       Perception     Praxis      Pertinent Vitals/Pain Pain Assessment: No/denies pain     Hand Dominance Right   Extremity/Trunk Assessment Upper Extremity Assessment Upper Extremity Assessment: Generalized weakness   Lower Extremity Assessment Lower Extremity Assessment: Defer to PT evaluation   Cervical / Trunk Assessment Cervical / Trunk Assessment: Normal   Communication Communication Communication: HOH   Cognition Arousal/Alertness: Awake/alert Behavior During Therapy: WFL for tasks assessed/performed Overall Cognitive Status: Within Functional Limits for tasks assessed                                 General Comments: A and O x 4   General Comments       Exercises Exercises: Other exercises(deferred due to low tolerance of mobility) Other Exercises Other Exercises: Pt issued theraband to complete as tolerated. Pt states he is familiar with theraband   Shoulder Instructions      Home Living Family/patient expects to be discharged to:: Private residence Living Arrangements: Spouse/significant other Available Help at Discharge: Family;Available 24 hours/day(wife can supervise,  not physically help) Type of Home: House Home Access: Stairs to enter [employer] of Steps: 8 Entrance Stairs-Rails: Right;Left;Can reach both Home Layout: One level     Bathroom Shower/Tub: Walk-in shower;Door   [employer] Toilet: Handicapped height Bathroom Accessibility: Yes How Accessible: Accessible via walker Home Equipment: None          Prior Functioning/Environment Level of Independence: Independent        Comments: still works, 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         OT Problem List: Decreased strength;Decreased activity tolerance;Impaired balance (sitting and/or standing);Decreased knowledge of use of DME or AE;Cardiopulmonary status limiting activity      OT Treatment/Interventions: Self-care/ADL training;Therapeutic exercise;Energy conservation;DME and/or AE instruction;Therapeutic activities;Patient/family education    OT Goals(Current goals can be found in the care plan section) Acute Rehab OT Goals Patient Stated Goal: to go home OT Goal Formulation: With patient Time For Goal Achievement: 07/30/18 Potential to Achieve Goals: Good  OT Frequency: Min 3X/week   Barriers to D/C:            Co-evaluation              AM-PAC OT "6 Clicks" Daily Activity     Outcome Measure Help from another person eating meals?: None Help from another person taking care of personal grooming?: None Help from another person toileting, which includes using toliet, bedpan, or urinal?: A Little Help from another person bathing (including washing, rinsing, drying)?: A Little Help from another person to put on and taking off regular upper body clothing?: A Little Help from another person to put on and taking off regular lower body clothing?: A Little 6 Click Score: 20   End of Session Equipment Utilized During Treatment: Rolling walker Nurse Communication: Mobility status  Activity Tolerance: Patient tolerated treatment well Patient left: in chair;with call bell/phone within reach  OT Visit Diagnosis: Unsteadiness on feet (R26.81);Muscle weakness (generalized) (M62.81)                Time: 5732-2025 OT Time Calculation (min): 25 min Charges:  OT General Charges $OT Visit: 1 Visit OT Evaluation $OT Eval Moderate Complexity: 1 Mod OT Treatments $Self Care/Home Management : 8-22 mins  Luisa Dago, OT/L   Acute OT Clinical Specialist Acute Rehabilitation Services Pager [phone] Office  [phone]   [facility] 07/16/2018, 5:30 PM

## 2018-07-16 NOTE — Progress Notes (Signed)
Patient refused to lay in prone position. Patient states he prefers to lay in right side lying position. Patient repositioned to stated comfort and a light blanket provided at patient request. NAD noted at this time, will continue to monitor and treat per MD orders

## 2018-07-17 LAB — CBC WITH DIFFERENTIAL/PLATELET
Abs Immature Granulocytes: 0.31 10*3/uL — ABNORMAL HIGH (ref 0.00–0.07)
Basophils Absolute: 0.1 10*3/uL (ref 0.0–0.1)
Basophils Relative: 1 %
Eosinophils Absolute: 0.2 10*3/uL (ref 0.0–0.5)
Eosinophils Relative: 3 %
HCT: 37 % — ABNORMAL LOW (ref 39.0–52.0)
Hemoglobin: 12.5 g/dL — ABNORMAL LOW (ref 13.0–17.0)
Immature Granulocytes: 4 %
Lymphocytes Relative: 12 %
Lymphs Abs: 0.9 10*3/uL (ref 0.7–4.0)
MCH: 31.7 pg (ref 26.0–34.0)
MCHC: 33.8 g/dL (ref 30.0–36.0)
MCV: 93.9 fL (ref 80.0–100.0)
Monocytes Absolute: 0.4 10*3/uL (ref 0.1–1.0)
Monocytes Relative: 5 %
Neutro Abs: 5.2 10*3/uL (ref 1.7–7.7)
Neutrophils Relative %: 75 %
Platelets: 296 10*3/uL (ref 150–400)
RBC: 3.94 MIL/uL — ABNORMAL LOW (ref 4.22–5.81)
RDW: 11.9 % (ref 11.5–15.5)
WBC: 7 10*3/uL (ref 4.0–10.5)
nRBC: 0 % (ref 0.0–0.2)

## 2018-07-17 LAB — COMPREHENSIVE METABOLIC PANEL
ALT: 88 U/L — ABNORMAL HIGH (ref 0–44)
AST: 90 U/L — ABNORMAL HIGH (ref 15–41)
Albumin: 2.9 g/dL — ABNORMAL LOW (ref 3.5–5.0)
Alkaline Phosphatase: 50 U/L (ref 38–126)
Anion gap: 9 (ref 5–15)
BUN: 16 mg/dL (ref 8–23)
CO2: 26 mmol/L (ref 22–32)
Calcium: 8.8 mg/dL — ABNORMAL LOW (ref 8.9–10.3)
Chloride: 100 mmol/L (ref 98–111)
Creatinine, Ser: 0.7 mg/dL (ref 0.61–1.24)
GFR calc Af Amer: >60 mL/min (ref >60)
GFR calc non Af Amer: >60 mL/min (ref >60)
Glucose, Bld: 95 mg/dL (ref 70–99)
Potassium: 4.3 mmol/L (ref 3.5–5.1)
Sodium: 135 mmol/L (ref 135–145)
Total Bilirubin: 1.1 mg/dL (ref 0.3–1.2)
Total Protein: 7.3 g/dL (ref 6.5–8.1)

## 2018-07-17 LAB — D-DIMER, QUANTITATIVE: D-Dimer, Quant: 1.18 ug/mL-FEU — ABNORMAL HIGH (ref 0.00–0.50)

## 2018-07-17 LAB — CULTURE, BLOOD (ROUTINE X 2)
Culture: NO GROWTH
Culture: NO GROWTH
Special Requests: ADEQUATE

## 2018-07-17 LAB — FIBRINOGEN: Fibrinogen: 722 mg/dL — ABNORMAL HIGH (ref 210–475)

## 2018-07-17 LAB — CK: Total CK: 40 U/L — ABNORMAL LOW (ref 49–397)

## 2018-07-17 LAB — C-REACTIVE PROTEIN: CRP: 7.4 mg/dL — ABNORMAL HIGH (ref <1.0)

## 2018-07-17 LAB — MAGNESIUM: Magnesium: 2.5 mg/dL — ABNORMAL HIGH (ref 1.7–2.4)

## 2018-07-17 LAB — FERRITIN: Ferritin: 1566 ng/mL — ABNORMAL HIGH (ref 24–336)

## 2018-07-17 MED ORDER — FUROSEMIDE 10 MG/ML IJ SOLN
40.0000 mg | Freq: Once | INTRAMUSCULAR | Status: AC
  Administered 2018-07-17: 08:00:00 40 mg via INTRAVENOUS
  Filled 2018-07-17: qty 4

## 2018-07-17 MED ORDER — TOCILIZUMAB 400 MG/20ML IV SOLN
8.0000 mg/kg | Freq: Once | INTRAVENOUS | Status: AC
  Administered 2018-07-17: 674 mg via INTRAVENOUS
  Filled 2018-07-17: qty 33.7

## 2018-07-17 NOTE — Progress Notes (Signed)
PROGRESS NOTE  Brent NumbersReid Ho ZOX:[id number] DOB: October 03, 1945 DOA: 07/12/2018 PCP: Angelica ChessmanAguiar, Rafaela M, MD   LOS: 5 days   Brief Narrative / Interim history: 73 year old male with history of hypertension, hyperlipidemia, anxiety/depression who was [facility] on 4/10, diagnosed with CAP and treated with antibiotics, he was discharged home, took antibiotics for 5 days but had no improvement in the symptoms, came back on 4/17, he was found to be hypoxic, tested positive for Covid as well as had lymphopenia, elevated inflammatory markers, and was admitted to the hospital.  He was initially at [facility] and transferred to [facility] on 4/18 on 3 L supplemental oxygen  Approximate date Covid symptoms started: 4/8.  Today day 14  Subjective: -Patient reports that he feels well this morning, complains of shortness of breath but denies getting worse.  Denies any chest pain, denies any abdominal pain, nausea or vomiting.  Assessment & Plan: Principal Problem:   COVID-19 virus infection Active Problems:   Hypercholesteremia   Hypertension   Sepsis (HCC)   Acute respiratory failure with hypoxia (HCC)   Depression   Principal Problem Acute Hypoxic Resp. Failure due to Acute Covid 19 Viral Illness during the ongoing 2020 Covid 19 Pandemic  -He continues to remain clinically stable on 3 L nasal cannula, he is on hydroxychloroquine which was started on 4/18 and is scheduled to be done on 4/22. The use of this medication is off-label, and its efficacy in this clinical situation is not definitively proven. The risks, including death by cardiac arrhythmia as well as side effects of rash, LFT elevations, and rarely retinopathy, were discussed and the patient chooses to proceed -Chest x-ray done 4/21 personally reviewed, with worsening bilateral patchy airspace disease, concerning for ARDS.  Given increased  Treatment so far -Hydroxychloroquine 4/18 >> 4/22 -Actemra times one 4/22  COVID-19  markers -Ferritin continues to increase every day, 1566 this morning -CRP progressively increasing 7.4 this morning -D-dimer stable, increased up to 2.15 but now back down to 1.18  Oxygenation requirements -3 L nasal cannula  Fluid status -Appears euvolemic, net +387 mL's, given worsening chest x-ray will give Lasix x1  Active Problems HTN - Hold losartan - Continue prn hydralazine. Normotensive.    Obesity: BMI 35.  - Noted  Hyperlipidemia - Continue statin  Scheduled Meds: . albuterol  2 puff Inhalation BID  . enoxaparin (LOVENOX) injection  40 mg Subcutaneous Daily  . mouth rinse  15 mL Mouth Rinse BID  . PARoxetine  20 mg Oral Daily  . simvastatin  10 mg Oral q1800   Continuous Infusions: . tocilizumab (ACTEMRA) IV 100 mL/hr at 07/17/18 1000   PRN Meds:.acetaminophen, albuterol, dextromethorphan-guaiFENesin, hydrALAZINE, HYDROcodone-homatropine, ondansetron **OR** ondansetron (ZOFRAN) IV, polyvinyl alcohol, senna-docusate, sodium chloride  DVT prophylaxis: Lovenox Code Status: Full code Family Communication: wife by phone, Sallyanne HaversBonnie Cosner, [phone] Disposition Plan: TBD  Consultants:   None   Procedures:   None   Antimicrobials:  Hydroxychloroquine 4/18 >> 4/22  Objective: Vitals:   07/17/18 0004 07/17/18 0200 07/17/18 0505 07/17/18 0909  BP: 115/72 94/61 118/70   Pulse: 90 80 91   Resp: (!) 29 (!) 38    Temp: 98.7 F (37.1 C)  97.8 F (36.6 C) (!) 97.4 F (36.3 C)  TempSrc: Axillary  Oral Oral  SpO2: 96% 96% 95%   Weight:      Height:        Intake/Output Summary (Last 24 hours) at 07/17/2018 1034 Last data filed at 07/17/2018  1000 Gross per 24 hour  Intake 623.14 ml  Output 1475 ml  Net -851.86 ml   Filed Weights   07/12/18 1905 07/13/18 0121  Weight: 95.2 kg 84.2 kg    Examination:  Constitutional: No apparent distress, visibly tachypneic Eyes: No icterus seen ENMT: Moist membranes Respiratory: Bilateral rhonchi, no crackles,  no wheezing Cardiovascular: Regular rate and rhythm, no murmurs appreciated.  No peripheral edema Abdomen: Soft, NT, ND, positive bowel sounds Musculoskeletal: no clubbing / cyanosis.  Skin: No new rashes Neurologic: No focal deficits Psychiatric: Alert and oriented x3   Data Reviewed: I have independently reviewed following labs and imaging studies   CBC: Recent Labs  Lab 07/13/18 0140 07/14/18 1000 07/15/18 0530 07/16/18 0450 07/17/18 0500  WBC 8.9 7.9 7.6 7.6 7.0  NEUTROABS 7.8* 6.6 6.3 5.8 5.2  HGB 14.4  14.5 13.2 12.8* 12.7* 12.5*  HCT 42.5 39.1 38.8* 38.2* 37.0*  MCV 94.0 94.9 94.6 94.6 93.9  PLT 222 230 264 273 296   Basic Metabolic Panel: Recent Labs  Lab 07/13/18 0140 07/14/18 1000 07/15/18 0530 07/16/18 0450 07/17/18 0500  NA 134* 136 135 134* 135  K 4.1 4.3 4.4 4.3 4.3  CL 101 102 101 100 100  CO2 GLUCOSE 119* 110* 99 108* 95  BUN 24* CREATININE 0.87 0.73 0.76 0.77 0.70  CALCIUM 8.7* 8.3* 8.4* 8.4* 8.8*  MG  --  2.6* 2.5* 2.4 2.5*   GFR: Estimated Creatinine Clearance: 78.6 mL/min (by C-G formula based on SCr of 0.7 mg/dL). Liver Function Tests: Recent Labs  Lab 07/13/18 0140 07/14/18 1000 07/15/18 0530 07/16/18 0450 07/17/18 0500  AST 59* 58* 53* 82* 90*  ALT 39 40 41 64* 88*  ALKPHOS 34* 35* 34* 41 50  BILITOT 1.2 1.1 1.0 0.9 1.1  PROT 7.3 6.5 6.4* 6.6 7.3  ALBUMIN 3.3* 2.8* 2.7* 2.7* 2.9*   No results for input(s): LIPASE, AMYLASE in the last 168 hours. No results for input(s): AMMONIA in the last 168 hours. Coagulation Profile: No results for input(s): INR, PROTIME in the last 168 hours. Cardiac Enzymes: Recent Labs  Lab 07/13/18 0140 07/14/18 1000 07/15/18 0530 07/16/18 0450 07/17/18 0500  CKTOTAL 251 132 75 63 40*  TROPONINI <0.03  --   --   --   --    BNP (last 3 results) No results for input(s): PROBNP in the last 8760 hours. HbA1C: No results for input(s): HGBA1C in the last 72 hours.  CBG: No results for input(s): GLUCAP in the last 168 hours. Lipid Profile: No results for input(s): CHOL, HDL, LDLCALC, TRIG, CHOLHDL, LDLDIRECT in the last 72 hours. Thyroid Function Tests: No results for input(s): TSH, T4TOTAL, FREET4, T3FREE, THYROIDAB in the last 72 hours. Anemia Panel: Recent Labs    07/16/18 0450 07/17/18 0500  FERRITIN 1,478* 1,566*   Urine analysis:    Component Value Date/Time   COLORURINE YELLOW 07/05/2018 2347   APPEARANCEUR CLEAR 07/05/2018 2347   LABSPEC 1.025 07/05/2018 2347   PHURINE 6.0 07/05/2018 2347   GLUCOSEU NEGATIVE 07/05/2018 2347   HGBUR NEGATIVE 07/05/2018 2347   BILIRUBINUR NEGATIVE 07/05/2018 2347   KETONESUR NEGATIVE 07/05/2018 2347   PROTEINUR NEGATIVE 07/05/2018 2347   NITRITE NEGATIVE 07/05/2018 2347   LEUKOCYTESUR NEGATIVE 07/05/2018 2347   Sepsis Labs: Invalid input(s): PROCALCITONIN, LACTICIDVEN  Recent Results (from the past 240 hour(s))  SARS Coronavirus 2 [facility] order, Performed in [facility] hospital lab)  Status: Abnormal   Collection Time: 07/12/18  6:39 PM  Result Value Ref Range Status   SARS Coronavirus 2 POSITIVE (A) NEGATIVE Final    Comment: RESULT CALLED TO, READ BACK BY AND VERIFIED WITH: FARRAR,S RN  ON 07/12/2018 JACKSON,K (NOTE) If result is NEGATIVE SARS-CoV-2 target nucleic acids are NOT DETECTED. The SARS-CoV-2 RNA is generally detectable in upper and lower  respiratory specimens during the acute phase of infection. The lowest  concentration of SARS-CoV-2 viral copies this assay can detect is 250  copies / mL. A negative result does not preclude SARS-CoV-2 infection  and should not be used as the sole basis for treatment or other  patient management decisions.  A negative result may occur with  improper specimen collection / handling, submission of specimen other  than nasopharyngeal swab, presence of viral mutation(s) within the  areas targeted by this assay, and inadequate number of  viral copies  (<250 copies / mL). A negative result must be combined with clinical  observations, patient history, and epidemiological information. If result is POSITIVE SARS-CoV-2 target nucleic acids are DETECTE D. The SARS-CoV-2 RNA is generally detectable in upper and lower  respiratory specimens during the acute phase of infection.  Positive  results are indicative of active infection with SARS-CoV-2.  Clinical  correlation with patient history and other diagnostic information is  necessary to determine patient infection status.  Positive results do  not rule out bacterial infection or co-infection with other viruses. If result is PRESUMPTIVE POSTIVE SARS-CoV-2 nucleic acids MAY BE PRESENT.   A presumptive positive result was obtained on the submitted specimen  and confirmed on repeat testing.  While 2019 novel coronavirus  (SARS-CoV-2) nucleic acids may be present in the submitted sample  additional confirmatory testing may be necessary for epidemiological  and / or clinical management purposes  to differentiate between  SARS-CoV-2 and other Sarbecovirus currently known to infect humans.  If clinically indicated additional testing with an alternate test  methodology (LAB745 3) is advised. The SARS-CoV-2 RNA is generally  detectable in upper and lower respiratory specimens during the acute  phase of infection. The expected result is Negative. Fact Sheet for Patients:  BoilerBrush.com.cy Fact Sheet for Healthcare Providers: https://pope.com/ This test is not yet approved or cleared by the [place] FDA and has been authorized for detection and/or diagnosis of SARS-CoV-2 by FDA under an Emergency Use Authorization (EUA).  This EUA will remain in effect (meaning this test can be used) for the duration of the COVID-19 declaration under Section 564(b)(1) of the Act, 21 U.S.C. section 360bbb-3(b)(1), unless the authorization is  terminated or revoked sooner. Performed at [facility], [address] [place], [place] [address]   Blood Culture (routine x 2)     Status: None (Preliminary result)   Collection Time: 07/12/18  7:50 PM  Result Value Ref Range Status   Specimen Description   Final    BLOOD LEFT HAND Performed at [facility], [address], [place], [place] [address]    Special Requests   Final    BOTTLES DRAWN AEROBIC AND ANAEROBIC Blood Culture results may not be optimal due to an excessive volume of blood received in culture bottles Performed at [facility], [address] [place], [place] [address]    Culture   Final    NO GROWTH 4 DAYS Performed at [facility], [address], [place], [place] [address]    Report Status PENDING  Incomplete  Blood Culture (routine x 2)     Status: None (Preliminary result)   Collection Time: 07/12/18  8:03 PM  Result Value Ref Range Status   Specimen Description   Final    BLOOD RIGHT HAND Performed at [facility], [address], [place], [place] [address]    Special Requests   Final    BOTTLES DRAWN AEROBIC AND ANAEROBIC Blood Culture adequate volume Performed at [facility], [address], [place], [place] [address]    Culture   Final    NO GROWTH 4 DAYS Performed at [facility], [address] [place], [place] [address]    Report Status PENDING  Incomplete  Respiratory Panel by PCR     Status: None   Collection Time: 07/12/18  9:02 PM  Result Value Ref Range Status   Adenovirus NOT DETECTED NOT DETECTED Final   Coronavirus 229E NOT DETECTED NOT DETECTED Final    Comment: (NOTE) The Coronavirus on the Respiratory Panel, DOES NOT test for the novel  Coronavirus (2019 nCoV)    Coronavirus HKU1 NOT DETECTED NOT DETECTED Final   Coronavirus NL63 NOT DETECTED NOT DETECTED Final   Coronavirus OC43 NOT DETECTED NOT DETECTED Final    Metapneumovirus NOT DETECTED NOT DETECTED Final   Rhinovirus / Enterovirus NOT DETECTED NOT DETECTED Final   Influenza A NOT DETECTED NOT DETECTED Final   Influenza B NOT DETECTED NOT DETECTED Final   Parainfluenza Virus 1 NOT DETECTED NOT DETECTED Final   Parainfluenza Virus 2 NOT DETECTED NOT DETECTED Final   Parainfluenza Virus 3 NOT DETECTED NOT DETECTED Final   Parainfluenza Virus 4 NOT DETECTED NOT DETECTED Final   Respiratory Syncytial Virus NOT DETECTED NOT DETECTED Final   Bordetella pertussis NOT DETECTED NOT DETECTED Final   Chlamydophila pneumoniae NOT DETECTED NOT DETECTED Final   Mycoplasma pneumoniae NOT DETECTED NOT DETECTED Final    Comment: Performed at [facility], [address] [place], [place] [address]      Radiology Studies: Dg Chest Port 1 View  Result Date: 07/16/2018 CLINICAL DATA:  Dyspnea EXAM: PORTABLE CHEST 1 VIEW COMPARISON:  07/12/2018 FINDINGS: Patchy airspace opacities throughout both lungs have worsened. Normal heart size. Low volumes. No pneumothorax or pleural effusion. IMPRESSION: Worsening bilateral patchy airspace disease. Findings are worrisome for bilateral pneumonia, ARDS, or atypical edema. Electronically Signed   By: Jolaine Click M.D.   On: 07/16/2018 08:58    Pamella Pert, MD, PhD [facility]  Contact via  [email or web address]  [facility] Office Info P: [phone]  F: [phone]

## 2018-07-17 NOTE — Progress Notes (Signed)
OT Cancellation Note  Patient Details Name: Jaeshaun Perret MRN: [id number] DOB: 18-Oct-1945   Cancelled Treatment:    Reason Eval/Treat Not Completed: Fatigue/lethargy limiting ability to participate. Attempted x 2. Pt sleeping soundly. Will attempt tomorrow.   Thornell Mule, OT/L   Acute OT Clinical Specialist Acute Rehabilitation Services Pager [phone] Office [phone]  07/17/2018, 3:49 PM

## 2018-07-18 LAB — QUANTIFERON-TB GOLD PLUS (RQFGPL)
QuantiFERON Mitogen Value: 0.04 IU/mL
QuantiFERON Nil Value: 0.02 IU/mL
QuantiFERON TB1 Ag Value: 0.02 IU/mL
QuantiFERON TB2 Ag Value: 0.02 IU/mL

## 2018-07-18 LAB — CBC WITH DIFFERENTIAL/PLATELET
Abs Immature Granulocytes: 0.27 10*3/uL — ABNORMAL HIGH (ref 0.00–0.07)
Basophils Absolute: 0.1 10*3/uL (ref 0.0–0.1)
Basophils Relative: 2 %
Eosinophils Absolute: 0.3 10*3/uL (ref 0.0–0.5)
Eosinophils Relative: 6 %
HCT: 38.2 % — ABNORMAL LOW (ref 39.0–52.0)
Hemoglobin: 13.1 g/dL (ref 13.0–17.0)
Immature Granulocytes: 5 %
Lymphocytes Relative: 20 %
Lymphs Abs: 1 10*3/uL (ref 0.7–4.0)
MCH: 32.1 pg (ref 26.0–34.0)
MCHC: 34.3 g/dL (ref 30.0–36.0)
MCV: 93.6 fL (ref 80.0–100.0)
Monocytes Absolute: 0.3 10*3/uL (ref 0.1–1.0)
Monocytes Relative: 6 %
Neutro Abs: 3.1 10*3/uL (ref 1.7–7.7)
Neutrophils Relative %: 61 %
Platelets: 321 10*3/uL (ref 150–400)
RBC: 4.08 MIL/uL — ABNORMAL LOW (ref 4.22–5.81)
RDW: 11.9 % (ref 11.5–15.5)
WBC: 5 10*3/uL (ref 4.0–10.5)
nRBC: 0 % (ref 0.0–0.2)

## 2018-07-18 LAB — FERRITIN: Ferritin: 1606 ng/mL — ABNORMAL HIGH (ref 24–336)

## 2018-07-18 LAB — CK: Total CK: 44 U/L — ABNORMAL LOW (ref 49–397)

## 2018-07-18 LAB — COMPREHENSIVE METABOLIC PANEL
ALT: 107 U/L — ABNORMAL HIGH (ref 0–44)
AST: 95 U/L — ABNORMAL HIGH (ref 15–41)
Albumin: 3 g/dL — ABNORMAL LOW (ref 3.5–5.0)
Alkaline Phosphatase: 50 U/L (ref 38–126)
Anion gap: 10 (ref 5–15)
BUN: 22 mg/dL (ref 8–23)
CO2: 27 mmol/L (ref 22–32)
Calcium: 8.9 mg/dL (ref 8.9–10.3)
Chloride: 99 mmol/L (ref 98–111)
Creatinine, Ser: 0.7 mg/dL (ref 0.61–1.24)
GFR calc Af Amer: >60 mL/min (ref >60)
GFR calc non Af Amer: >60 mL/min (ref >60)
Glucose, Bld: 92 mg/dL (ref 70–99)
Potassium: 3.7 mmol/L (ref 3.5–5.1)
Sodium: 136 mmol/L (ref 135–145)
Total Bilirubin: 0.8 mg/dL (ref 0.3–1.2)
Total Protein: 7.3 g/dL (ref 6.5–8.1)

## 2018-07-18 LAB — QUANTIFERON-TB GOLD PLUS: QuantiFERON-TB Gold Plus: UNDETERMINED

## 2018-07-18 LAB — FIBRINOGEN: Fibrinogen: 737 mg/dL — ABNORMAL HIGH (ref 210–475)

## 2018-07-18 LAB — MAGNESIUM: Magnesium: 2.5 mg/dL — ABNORMAL HIGH (ref 1.7–2.4)

## 2018-07-18 LAB — C-REACTIVE PROTEIN: CRP: 5.1 mg/dL — ABNORMAL HIGH (ref <1.0)

## 2018-07-18 LAB — D-DIMER, QUANTITATIVE: D-Dimer, Quant: 0.79 ug/mL-FEU — ABNORMAL HIGH (ref 0.00–0.50)

## 2018-07-18 MED ORDER — FUROSEMIDE 10 MG/ML IJ SOLN
60.0000 mg | Freq: Every day | INTRAMUSCULAR | Status: DC
  Administered 2018-07-18: 14:00:00 60 mg via INTRAVENOUS

## 2018-07-18 MED ORDER — FUROSEMIDE 10 MG/ML IJ SOLN
60.0000 mg | Freq: Every day | INTRAMUSCULAR | Status: DC
  Administered 2018-07-19: 09:00:00 60 mg via INTRAVENOUS
  Filled 2018-07-18 (×2): qty 8

## 2018-07-18 MED ORDER — POTASSIUM CHLORIDE CRYS ER 20 MEQ PO TBCR
40.0000 meq | EXTENDED_RELEASE_TABLET | Freq: Once | ORAL | Status: AC
  Administered 2018-07-18: 14:00:00 40 meq via ORAL
  Filled 2018-07-18: qty 2

## 2018-07-18 NOTE — Progress Notes (Signed)
PROGRESS NOTE  Brent Ho WUJ:[id number] DOB: [birth year] DOA: 07/12/2018 PCP: Angelica Chessman, MD   LOS: 6 days   Brief Narrative / Interim history:  73 year old male with history of hypertension, hyperlipidemia, anxiety/depression who was initially evaluated at [facility] on 4/10, diagnosed with CAP and treated with antibiotics, he was discharged home, took antibiotics for 5 days but had no improvement in the symptoms, came back on 4/17, he was found to be hypoxic, tested positive for Covid as well as had lymphopenia, elevated inflammatory markers, and was admitted to the hospital.  He was initially at [facility] long and transferred to [facility] on 4/18 on 3 L supplemental oxygen  Approximate date Covid symptoms started: 4/8.  Today day 14  Subjective:  -Patient reports he is feeling better today, dyspnea has been improving, denies any chest pain or fever .   Assessment & Plan: Principal Problem:   COVID-19 virus infection Active Problems:   Hypercholesteremia   Hypertension   Sepsis (HCC)   Acute respiratory failure with hypoxia (HCC)   Depression   Acute Hypoxic Resp. Failure due to Acute Covid 19 Viral Illness during the ongoing 2020 Covid 19 Pandemic  -Patient remains on 3 L nasal cannula this morning, but he reports his subjective dyspnea has improved, I will start on IV Lasix today 60 mg once daily, will monitor electrolytes closely and replete as needed. -Discussed with patient, nursing staff, he was encouraged to use incentive spirometry today, get out of bed to chair, and perform awake prone up to 16 hours daily. -Chest x-ray done 4/21  with worsening bilateral patchy airspace disease, concerning for ARDS.  Given increased -Hydroxychloroquine 4/18 >> 4/22 -Actemra times one 4/22 -Continue to follow inflammatory markers daily CRP, MRSA trending down  HTN - Hold losartan - Continue prn hydralazine. Normotensive.    Obesity: BMI 35.  - Noted  Hyperlipidemia - Continue statin   Scheduled Meds: . albuterol  2 puff Inhalation BID  . enoxaparin (LOVENOX) injection  40 mg Subcutaneous Daily  . furosemide  60 mg Intravenous Daily  . mouth rinse  15 mL Mouth Rinse BID  . PARoxetine  20 mg Oral Daily  . potassium chloride  40 mEq Oral Once  . simvastatin  10 mg Oral q1800   Continuous Infusions:  PRN Meds:.acetaminophen, albuterol, dextromethorphan-guaiFENesin, hydrALAZINE, HYDROcodone-homatropine, ondansetron **OR** ondansetron (ZOFRAN) IV, polyvinyl alcohol, senna-docusate, sodium chloride  DVT prophylaxis: Lovenox Code Status: Full code Family Communication: Discussed with wife via phone 07/18/2018 Disposition Plan: Home once stable and off oxygen  Consultants:   None   Procedures:   None   Antimicrobials:  Hydroxychloroquine 4/18 >> 4/22  Objective: Vitals:   07/18/18 0206 07/18/18 0400 07/18/18 0600 07/18/18 0846  BP: 106/65 93/68  112/71  Pulse:  77  93  Resp:  (!) 22  20  Temp: (!) 97 F (36.1 C)  (!) 97.1 F (36.2 C) 98.1 F (36.7 C)  TempSrc: Oral   Oral  SpO2:  96%  94%  Weight:      Height:        Intake/Output Summary (Last 24 hours) at 07/18/2018 1148 Last data filed at 07/18/2018 0600 Gross per 24 hour  Intake 696.91 ml  Output 1625 ml  Net -928.09 ml   Filed Weights   07/12/18 1905 07/13/18 0121  Weight: 95.2 kg 84.2 kg    Examination:  Awake Alert, Oriented X 3, No new F.N deficits, Normal affect Symmetrical Chest wall movement, Good air movement bilaterally, CTAB  RRR,No Gallops,Rubs or new Murmurs, No Parasternal Heave +ve B.Sounds, Abd Soft, No tenderness, No rebound - guarding or rigidity. No Cyanosis, Clubbing or edema, No new Rash or bruise      Data Reviewed: I have independently reviewed following labs and imaging studies   CBC: Recent Labs  Lab 07/14/18 1000 07/15/18 0530 07/16/18 0450 07/17/18 0500 07/18/18 0500  WBC 7.9 7.6 7.6 7.0 5.0  NEUTROABS 6.6 6.3 5.8 5.2 3.1  HGB 13.2 12.8* 12.7*  12.5* 13.1  HCT 39.1 38.8* 38.2* 37.0* 38.2*  MCV 94.9 94.6 94.6 93.9 93.6  PLT 230 264 273 296 321   Basic Metabolic Panel: Recent Labs  Lab 07/14/18 1000 07/15/18 0530 07/16/18 0450 07/17/18 0500 07/18/18 0500  NA 136 135 134* 135 136  K 4.3 4.4 4.3 4.3 3.7  CL 102 101 100 100 99  CO2 GLUCOSE 110* 99 108* 95 92  BUN CREATININE 0.73 0.76 0.77 0.70 0.70  CALCIUM 8.3* 8.4* 8.4* 8.8* 8.9  MG 2.6* 2.5* 2.4 2.5* 2.5*   GFR: Estimated Creatinine Clearance: 78.6 mL/min (by C-G formula based on SCr of 0.7 mg/dL). Liver Function Tests: Recent Labs  Lab 07/14/18 1000 07/15/18 0530 07/16/18 0450 07/17/18 0500 07/18/18 0500  AST 58* 53* 82* 90* 95*  ALT 40 41 64* 88* 107*  ALKPHOS 35* 34* 41 50 50  BILITOT 1.1 1.0 0.9 1.1 0.8  PROT 6.5 6.4* 6.6 7.3 7.3  ALBUMIN 2.8* 2.7* 2.7* 2.9* 3.0*   No results for input(s): LIPASE, AMYLASE in the last 168 hours. No results for input(s): AMMONIA in the last 168 hours. Coagulation Profile: No results for input(s): INR, PROTIME in the last 168 hours. Cardiac Enzymes: Recent Labs  Lab 07/13/18 0140 07/14/18 1000 07/15/18 0530 07/16/18 0450 07/17/18 0500 07/18/18 0500  CKTOTAL 251 132 75 63 40* 44*  TROPONINI <0.03  --   --   --   --   --    BNP (last 3 results) No results for input(s): PROBNP in the last 8760 hours. HbA1C: No results for input(s): HGBA1C in the last 72 hours. CBG: No results for input(s): GLUCAP in the last 168 hours. Lipid Profile: No results for input(s): CHOL, HDL, LDLCALC, TRIG, CHOLHDL, LDLDIRECT in the last 72 hours. Thyroid Function Tests: No results for input(s): TSH, T4TOTAL, FREET4, T3FREE, THYROIDAB in the last 72 hours. Anemia Panel: Recent Labs    07/17/18 0500 07/18/18 0500  FERRITIN 1,566* 1,606*   Urine analysis:    Component Value Date/Time   COLORURINE YELLOW 07/05/2018 2347   APPEARANCEUR CLEAR 07/05/2018 2347   LABSPEC 1.025 07/05/2018 2347    PHURINE 6.0 07/05/2018 2347   GLUCOSEU NEGATIVE 07/05/2018 2347   HGBUR NEGATIVE 07/05/2018 2347   BILIRUBINUR NEGATIVE 07/05/2018 2347   KETONESUR NEGATIVE 07/05/2018 2347   PROTEINUR NEGATIVE 07/05/2018 2347   NITRITE NEGATIVE 07/05/2018 2347   LEUKOCYTESUR NEGATIVE 07/05/2018 2347   Sepsis Labs: Invalid input(s): PROCALCITONIN, LACTICIDVEN  Recent Results (from the past 240 hour(s))  SARS Coronavirus 2 [facility] order, Performed in [facility] hospital lab)     Status: Abnormal   Collection Time: 07/12/18  6:39 PM  Result Value Ref Range Status   SARS Coronavirus 2 POSITIVE (A) NEGATIVE Final    Comment: RESULT CALLED TO, READ BACK BY AND VERIFIED WITH: FARRAR,S RN  ON 07/12/2018 JACKSON,K (NOTE) If result is NEGATIVE SARS-CoV-2 target nucleic acids are NOT DETECTED. The SARS-CoV-2 RNA is  generally detectable in upper and lower  respiratory specimens during the acute phase of infection. The lowest  concentration of SARS-CoV-2 viral copies this assay can detect is 250  copies / mL. A negative result does not preclude SARS-CoV-2 infection  and should not be used as the sole basis for treatment or other  patient management decisions.  A negative result may occur with  improper specimen collection / handling, submission of specimen other  than nasopharyngeal swab, presence of viral mutation(s) within the  areas targeted by this assay, and inadequate number of viral copies  (<250 copies / mL). A negative result must be combined with clinical  observations, patient history, and epidemiological information. If result is POSITIVE SARS-CoV-2 target nucleic acids are DETECTE D. The SARS-CoV-2 RNA is generally detectable in upper and lower  respiratory specimens during the acute phase of infection.  Positive  results are indicative of active infection with SARS-CoV-2.  Clinical  correlation with patient history and other diagnostic information is  necessary to determine patient  infection status.  Positive results do  not rule out bacterial infection or co-infection with other viruses. If result is PRESUMPTIVE POSTIVE SARS-CoV-2 nucleic acids MAY BE PRESENT.   A presumptive positive result was obtained on the submitted specimen  and confirmed on repeat testing.  While 2019 novel coronavirus  (SARS-CoV-2) nucleic acids may be present in the submitted sample  additional confirmatory testing may be necessary for epidemiological  and / or clinical management purposes  to differentiate between  SARS-CoV-2 and other Sarbecovirus currently known to infect humans.  If clinically indicated additional testing with an alternate test  methodology (LAB745 3) is advised. The SARS-CoV-2 RNA is generally  detectable in upper and lower respiratory specimens during the acute  phase of infection. The expected result is Negative. Fact Sheet for Patients:  BoilerBrush.com.cy Fact Sheet for Healthcare Providers: https://pope.com/ This test is not yet approved or cleared by the [place] FDA and has been authorized for detection and/or diagnosis of SARS-CoV-2 by FDA under an Emergency Use Authorization (EUA).  This EUA will remain in effect (meaning this test can be used) for the duration of the COVID-19 declaration under Section 564(b)(1) of the Act, 21 U.S.C. section 360bbb-3(b)(1), unless the authorization is terminated or revoked sooner. Performed at [facility], [address] [place], [place] [address]   Blood Culture (routine x 2)     Status: None   Collection Time: 07/12/18  7:50 PM  Result Value Ref Range Status   Specimen Description   Final    BLOOD LEFT HAND Performed at [facility], [address] [place], [place] [address]    Special Requests   Final    BOTTLES DRAWN AEROBIC AND ANAEROBIC Blood Culture results may not be optimal due to an excessive volume of blood  received in culture bottles Performed at [facility], [address], [place], [place] [address]    Culture   Final    NO GROWTH 5 DAYS Performed at [facility], [address] [place], [place] [address]    Report Status 07/17/2018 FINAL  Final  Blood Culture (routine x 2)     Status: None   Collection Time: 07/12/18  8:03 PM  Result Value Ref Range Status   Specimen Description   Final    BLOOD RIGHT HAND Performed at [facility], [address] [place], [place] [address]    Special Requests  Final    BOTTLES DRAWN AEROBIC AND ANAEROBIC Blood Culture adequate volume Performed at [facility], [address] [place], [place] [address]    Culture   Final    NO GROWTH 5 DAYS Performed at [facility], [address] [place], [place] [address]    Report Status 07/17/2018 FINAL  Final  Respiratory Panel by PCR     Status: None   Collection Time: 07/12/18  9:02 PM  Result Value Ref Range Status   Adenovirus NOT DETECTED NOT DETECTED Final   Coronavirus 229E NOT DETECTED NOT DETECTED Final    Comment: (NOTE) The Coronavirus on the Respiratory Panel, DOES NOT test for the novel  Coronavirus (2019 nCoV)    Coronavirus HKU1 NOT DETECTED NOT DETECTED Final   Coronavirus NL63 NOT DETECTED NOT DETECTED Final   Coronavirus OC43 NOT DETECTED NOT DETECTED Final   Metapneumovirus NOT DETECTED NOT DETECTED Final   Rhinovirus / Enterovirus NOT DETECTED NOT DETECTED Final   Influenza A NOT DETECTED NOT DETECTED Final   Influenza B NOT DETECTED NOT DETECTED Final   Parainfluenza Virus 1 NOT DETECTED NOT DETECTED Final   Parainfluenza Virus 2 NOT DETECTED NOT DETECTED Final   Parainfluenza Virus 3 NOT DETECTED NOT DETECTED Final   Parainfluenza Virus 4 NOT DETECTED NOT DETECTED Final   Respiratory Syncytial Virus NOT DETECTED NOT DETECTED Final   Bordetella pertussis NOT DETECTED NOT DETECTED Final   Chlamydophila  pneumoniae NOT DETECTED NOT DETECTED Final   Mycoplasma pneumoniae NOT DETECTED NOT DETECTED Final    Comment: Performed at [facility], [address] [place], [place] [address]      Radiology Studies: No results found.  Huey Bienenstockawood Kyrah Schiro MD [facility]  Contact via  [email or web address]  [facility] Office Info P: [phone]  F: [phone]

## 2018-07-18 NOTE — Progress Notes (Addendum)
OT Treatment Note  Pt making progress. SpO2 93-97 RA; HR sustained 121-125 while standing at sink for grooming and toileting. Pt states "I feel wiped out just standing and washing my hands". Required extended seated rest break before ambulating with PT.  Encourage nsg to ambulate pt to the bathroom.  Will continue to follow acutely. Encouraged pt to complete his HEP as tolerated.  Continue to recommend HHOT for follow up.     07/18/18 1600  OT Visit Information  Last OT Received On 07/18/18  Assistance Needed +1  History of Present Illness 73 y.o. male admitted on 07/05/18 with fever.  Dx with acute hypoxic respiratory failure due to COVID 19.  He was transferred to [facility] on 07/12/18 on 3 L O2 [place].  He has not been intubated.  Pt with significant PMH of HTN and anxiety.  Precautions  Precautions Fall  Precaution Comments watch HR  Pain Assessment  Pain Assessment Faces  Faces Pain Scale 2  Pain Location general discomfort  Pain Descriptors / Indicators Discomfort  Pain Intervention(s) Limited activity within patient's tolerance  Cognition  Arousal/Alertness Awake/alert  Behavior During Therapy [facility] for tasks assessed/performed  General Comments slow processing  Upper Extremity Assessment  Upper Extremity Assessment Generalized weakness  Lower Extremity Assessment  Lower Extremity Assessment Defer to PT evaluation  ADL  Grooming Set up;Standing  Lower Body Bathing Min guard;Sit to/from Freight forwarder guard;RW  Toileting- Architect and Hygiene Set up;Sit to/from stand;Supervision/safety  Functional mobility during ADLs Min guard;Rolling walker;Cueing for safety  Bed Mobility  General bed mobility comments OOB on BSC  Balance  Overall balance assessment Needs assistance  Sitting-balance support Feet supported  Sitting balance-Leahy Scale Good  Standing balance-Leahy Scale Fair  Transfers  Overall transfer level Needs assistance  Equipment used Rolling  walker (2 wheeled)  Transfers Sit to/from Stand;Stand Pivot Transfers  Sit to Stand Supervision  Stand pivot transfers Min assist  Exercises  Exercises General Upper Extremity  General Exercises - Upper Extremity  Shoulder Flexion Strengthening;15 reps;Both;Seated  Shoulder Extension Strengthening;Seated;15 reps  Shoulder ADduction Both;15 reps;Seated  Elbow Flexion Strengthening;Both;15 reps  Elbow Extension Strengthening;Both;15 reps  Digit Composite Flexion Strengthening;Both;15 reps  Other Exercises  Other Exercises Encouraged pt ot complete HEP as he is able to do so  OT - End of Session  Equipment Utilized During Treatment Rolling walker  Activity Tolerance Patient tolerated treatment well  Patient left in chair;with call bell/phone within reach  Nurse Communication Mobility status  OT Assessment/Plan  OT Plan Discharge plan remains appropriate  OT Visit Diagnosis Unsteadiness on feet (R26.81);Muscle weakness (generalized) (M62.81)  OT Frequency (ACUTE ONLY) Min 3X/week  Follow Up Recommendations Supervision - Intermittent;Home health OT  OT Equipment None recommended by OT  AM-PAC OT "6 Clicks" Daily Activity Outcome Measure (Version 2)  Help from another person eating meals? 4  Help from another person taking care of personal grooming? 4  Help from another person toileting, which includes using toliet, bedpan, or urinal? 3  Help from another person bathing (including washing, rinsing, drying)? 3  Help from another person to put on and taking off regular upper body clothing? 4  Help from another person to put on and taking off regular lower body clothing? 3  6 Click Score 21  OT Goal Progression  Progress towards OT goals Progressing toward goals  Acute Rehab OT Goals  Patient Stated Goal to go home  OT Goal Formulation With patient  Time For Goal Achievement  07/30/18  Potential to Achieve Goals Good  ADL Goals  Pt Will Perform Lower Body Dressing with modified  independence;sit to/from stand  Pt Will Transfer to Toilet with modified independence;ambulating;regular height toilet  Pt Will Perform Toileting - Clothing Manipulation and hygiene with modified independence;sit to/from stand  Pt/caregiver will Perform Home Exercise Program Increased strength;Both right and left upper extremity;With written HEP provided  Additional ADL Goal #1 Pt will independently verbalize 3 energy conservation strategies  OT Time Calculation  OT Start Time (ACUTE ONLY) 1405  OT Stop Time (ACUTE ONLY) 1438  OT Time Calculation (min) 33 min  OT General Charges  $OT Visit 1 Visit  OT Treatments  $Self Care/Home Management  23-37 mins  Luisa DagoHilary Savina Olshefski, OT/L   Acute OT Clinical Specialist Acute Rehabilitation Services Pager [phone] Office [phone]

## 2018-07-18 NOTE — Progress Notes (Signed)
Physical Therapy Treatment Patient Details Name: Brent Ho Veach MRN: [id number] DOB: 23-Dec-1945 Today's Date: 07/18/2018    History of Present Illness 73 y.o. male admitted on 07/05/18 with fever.  Dx with acute hypoxic respiratory failure due to COVID 19.  [facility] 07[facility]12[facility]18 on 3 L O2 [place].  He has not been intubated.  Pt with significant PMH of HTN and anxiety.    PT Comments    Patient  Very pleased with progress, ambulated x 790' with RW on RA. Saturation dropped to 87% with quick recovery  To 93%. continue PT/ ambualtion.  Follow Up Recommendations  Home health PT;Supervision for mobility/OOB     Equipment Recommendations  Rolling walker with 5" wheels;3in1 (PT);Other (comment)    Recommendations for Other Services       Precautions / Restrictions Precautions Precautions: Fall Precaution Comments: watch HR and sats    Mobility  Bed Mobility               General bed mobility comments: oob  Transfers Overall transfer level: Needs assistance Equipment used: Rolling walker (2 wheeled) Transfers: Sit to/from Stand Sit to Stand: Min guard         General transfer comment: extra time to rise from recliner  Ambulation/Gait Ambulation/Gait assistance: Min assist Gait Distance (Feet): 90 Feet Assistive device: Rolling walker (2 wheeled) Gait Pattern/deviations: Step-through pattern Gait velocity: decr   General Gait Details: gait is slow, steady, turns  without balance losses. Pt. on RA, sats dropped to 87% with quick recovery to 93%   Stairs             Wheelchair Mobility    Modified Rankin (Stroke Patients Only)       Balance           Standing balance support: Bilateral upper extremity supported Standing balance-Leahy Scale: Fair                              Cognition Arousal/Alertness: Awake/alert Behavior During Therapy: WFL for tasks assessed/performed                                    General Comments: slow processing      Exercises General Exercises - Upper Extremity Shoulder Flexion: Strengthening;15 reps;Both;Seated Shoulder Extension: Strengthening;Seated;15 reps Shoulder ADduction: Both;15 reps;Seated Elbow Flexion: Strengthening;Both;15 reps Elbow Extension: Strengthening;Both;15 reps Digit Composite Flexion: Strengthening;Both;15 reps    General Comments        Pertinent Vitals/Pain Pain Assessment: No/denies pain Faces Pain Scale: Hurts a little bit Pain Location: general discomfort Pain Descriptors / Indicators: Discomfort Pain Intervention(s): Limited activity within patient's tolerance    Home Living                      Prior Function            PT Goals (current goals can now be found in the care plan section) Progress towards PT goals: Progressing toward goals    Frequency    Min 5X/week      PT Plan Current plan remains appropriate    Co-evaluation              AM-PAC PT "6 Clicks" Mobility   Outcome Measure  Help needed turning from your back to your side while in a flat bed without using bedrails?: A Little  Help needed moving from lying on your back to sitting on the side of a flat bed without using bedrails?: A Little Help needed moving to and from a bed to a chair (including a wheelchair)?: A Little Help needed standing up from a chair using your arms (e.g., wheelchair or bedside chair)?: A Little Help needed to walk in hospital room?: A Little Help needed climbing 3-5 steps with a railing? : A Lot 6 Click Score: 17    End of Session Equipment Utilized During Treatment: Gait belt Activity Tolerance: Patient tolerated treatment well Patient left: in chair;with call bell/phone within reach;with nursing/sitter in room Nurse Communication: Mobility status PT Visit Diagnosis: Muscle weakness (generalized) (M62.81);Difficulty in walking, not elsewhere classified (R26.2)     Time: 6712-4580 PT Time  Calculation (min) (ACUTE ONLY): 27 min  Charges:  $Gait Training: 23-37 mins                     Blanchard Kelch PT Acute Rehabilitation Services Pager [phone] Office [phone]    Rada Hay 07/18/2018, 5:00 PM

## 2018-07-18 NOTE — Progress Notes (Signed)
Pt spoke with wife this am via phone. Wife aware of plan.

## 2018-07-19 LAB — FERRITIN: Ferritin: 1345 ng/mL — ABNORMAL HIGH (ref 24–336)

## 2018-07-19 LAB — BASIC METABOLIC PANEL
Anion gap: 10 (ref 5–15)
BUN: 22 mg/dL (ref 8–23)
CO2: 27 mmol/L (ref 22–32)
Calcium: 9.2 mg/dL (ref 8.9–10.3)
Chloride: 100 mmol/L (ref 98–111)
Creatinine, Ser: 0.91 mg/dL (ref 0.61–1.24)
GFR calc Af Amer: >60 mL/min (ref >60)
GFR calc non Af Amer: >60 mL/min (ref >60)
Glucose, Bld: 114 mg/dL — ABNORMAL HIGH (ref 70–99)
Potassium: 4.5 mmol/L (ref 3.5–5.1)
Sodium: 137 mmol/L (ref 135–145)

## 2018-07-19 LAB — CBC
HCT: 41.4 % (ref 39.0–52.0)
Hemoglobin: 13.9 g/dL (ref 13.0–17.0)
MCH: 32 pg (ref 26.0–34.0)
MCHC: 33.6 g/dL (ref 30.0–36.0)
MCV: 95.2 fL (ref 80.0–100.0)
Platelets: 382 10*3/uL (ref 150–400)
RBC: 4.35 MIL/uL (ref 4.22–5.81)
RDW: 11.9 % (ref 11.5–15.5)
WBC: 5.1 10*3/uL (ref 4.0–10.5)
nRBC: 0 % (ref 0.0–0.2)

## 2018-07-19 LAB — MAGNESIUM: Magnesium: 2.5 mg/dL — ABNORMAL HIGH (ref 1.7–2.4)

## 2018-07-19 LAB — FIBRINOGEN: Fibrinogen: 657 mg/dL — ABNORMAL HIGH (ref 210–475)

## 2018-07-19 LAB — C-REACTIVE PROTEIN: CRP: 1.9 mg/dL — ABNORMAL HIGH (ref <1.0)

## 2018-07-19 MED ORDER — ACETAMINOPHEN 325 MG PO TABS: 650.0000 mg | ORAL_TABLET | Freq: Four times a day (QID) | ORAL | Status: DC | PRN

## 2018-07-19 MED ORDER — GUAIFENESIN ER 600 MG PO TB12: 600.0000 mg | ORAL_TABLET | Freq: Two times a day (BID) | ORAL | 0 refills | Status: AC

## 2018-07-19 NOTE — Progress Notes (Signed)
Patient discharged to home w/ wife. Given all belongings, instructions, equipment. Patient educated thoroughly about teaching, verbalized understanding, explained back to RN. Escorted to pov via w/c in mask. Patient's wife wearing mask in pov.

## 2018-07-19 NOTE — Progress Notes (Signed)
Physical Therapy Treatment Patient Details Name: Brent Ho MRN: [id number] DOB: [birth year] Today's Date: 07/19/2018    History of Present Illness 73 y.o. male admitted on 07/05/18 with fever.  Dx with acute hypoxic respiratory failure due to COVID 19.  He was transferred to [facility] on 07/12/18 on 3 L O2 [place].  He has not been intubated.  Pt with significant PMH of HTN and anxiety.    PT Comments    Pt progressing well; SpO2= 94-98% on RA during PT session; continue to recommend HHPT;   Follow Up Recommendations  Home health PT;Supervision for mobility/OOB     Equipment Recommendations  Rolling walker with 5" wheels;3in1 (PT);Other (comment)    Recommendations for Other Services       Precautions / Restrictions Precautions Precautions: Fall Precaution Comments: watch HR and sats Restrictions Weight Bearing Restrictions: No    Mobility  Bed Mobility   Bed Mobility: Supine to Sit     Supine to sit: Modified independent (Device/Increase time)        Transfers Overall transfer level: Needs assistance Equipment used: Rolling walker (2 wheeled) Transfers: Sit to/from Stand Sit to Stand: Supervision         General transfer comment: occasional cues for hand placement  Ambulation/Gait Ambulation/Gait assistance: Min guard;Supervision Gait Distance (Feet): 85 Feet(15') Assistive device: Rolling walker (2 wheeled) Gait Pattern/deviations: Step-through pattern;Decreased stride length Gait velocity: decr   General Gait Details: slow but steady, no overt LOB; fatigues easily; SpO2= 94-98% on RA   Stairs             Wheelchair Mobility    Modified Rankin (Stroke Patients Only)       Balance     Sitting balance-Leahy Scale: Good     Standing balance support: No upper extremity supported;During functional activity Standing balance-Leahy Scale: Fair                              Cognition Arousal/Alertness: Awake/alert Behavior During Therapy:  WFL for tasks assessed/performed Overall Cognitive Status: Within Functional Limits for tasks assessed                                        Exercises General Exercises - Lower Extremity Hip Flexion/Marching: AROM;10 reps;Standing Other Exercises Other Exercises: encouraged to amb every hour at home    General Comments        Pertinent Vitals/Pain Pain Assessment: No/denies pain    Home Living                      Prior Function            PT Goals (current goals can now be found in the care plan section) Acute Rehab PT Goals Patient Stated Goal: to go home PT Goal Formulation: With patient Time For Goal Achievement: 07/30/18 Potential to Achieve Goals: Good Progress towards PT goals: Progressing toward goals    Frequency    Min 5X/week      PT Plan Current plan remains appropriate    Co-evaluation              AM-PAC PT "6 Clicks" Mobility   Outcome Measure  Help needed turning from your back to your side while in a flat bed without using bedrails?: None Help needed moving from lying on your back to sitting  on the side of a flat bed without using bedrails?: None Help needed moving to and from a bed to a chair (including a wheelchair)?: A Little Help needed standing up from a chair using your arms (e.g., wheelchair or bedside chair)?: A Little Help needed to walk in hospital room?: A Little Help needed climbing 3-5 steps with a railing? : A Little 6 Click Score: 20    End of Session Equipment Utilized During Treatment: Gait belt Activity Tolerance: Patient tolerated treatment well;Patient limited by fatigue Patient left: in bed;with call bell/phone within reach Nurse Communication: Mobility status PT Visit Diagnosis: Muscle weakness (generalized) (M62.81);Difficulty in walking, not elsewhere classified (R26.2)     Time: 1000-1029 PT Time Calculation (min) (ACUTE ONLY): 29 min  Charges:  $Gait Training: 23-37 mins                      Brent Ho, PT  Pager: [phone] Acute Rehab Dept [facility]): [phone]   07/19/2018    [place] [facility]ILLIAMS,Brent Ho 07/19/2018, 10:30 AM

## 2018-07-19 NOTE — Progress Notes (Signed)
Occupational Therapy Treatment Patient Details Name: Brent Ho MRN: [id number] DOB: [birth year] Today's Date: 07/19/2018    History of present illness 73 y.o. male admitted on 07/05/18 with fever.  Dx with acute hypoxic respiratory failure due to COVID 19.  [facility] on 3 L O2 [place].  He has not been intubated.  Pt with significant PMH of HTN and anxiety.   OT comments  Completed education on energy conservation and fall prevention. Pt verbalized understanding. Would benefit form HHOT.   Follow Up Recommendations  Supervision - Intermittent;Home health OT    Equipment Recommendations  None recommended by OT    Recommendations for Other Services      Precautions / Restrictions Precautions Precautions: Fall Precaution Comments: watch HR and sats Restrictions Weight Bearing Restrictions: No       Mobility Bed Mobility   Bed Mobility: Supine to Sit     Supine to sit: Modified independent (Device/Increase time)        Transfers Overall transfer level: Needs assistance Equipment used: Rolling walker (2 wheeled) Transfers: Sit to/from Stand Sit to Stand: Supervision         General transfer comment: occasional cues for hand placement    Balance     Sitting balance-Leahy Scale: Good     Standing balance support: No upper extremity supported;During functional activity Standing balance-Leahy Scale: Fair                             ADL either performed or assessed with clinical judgement   ADL                                       Functional mobility during ADLs: Supervision/safety;Rolling walker General ADL Comments: completed education regarding energy conservation and fall prevention. Handouts reviewed. Pt verbalized understanidng.      Vision   Additional Comments: wears glasses   Perception     Praxis      Cognition Arousal/Alertness: Awake/alert Behavior During Therapy: WFL for tasks  assessed/performed Overall Cognitive Status: Within Functional Limits for tasks assessed                                 General Comments: slow processing        Exercises General Exercises - Upper Extremity Shoulder Flexion: Strengthening General Exercises - Lower Extremity Hip Flexion/Marching: AROM;10 reps;Standing Other Exercises Other Exercises: Reviewed HEP   Shoulder Instructions       General Comments      Pertinent Vitals/ Pain       Pain Assessment: No/denies pain  Home Living                                          Prior Functioning/Environment              Frequency  Min 3X/week        Progress Toward Goals  OT Goals(current goals can now be found in the care plan section)  Progress towards OT goals: Progressing toward goals  Acute Rehab OT Goals Patient Stated Goal: to go home OT Goal Formulation: With patient Time For Goal Achievement: 07/30/18 Potential to Achieve Goals: Good ADL  Goals Pt Will Perform Lower Body Dressing: with modified independence;sit to/from stand Pt Will Transfer to Toilet: with modified independence;ambulating;regular height toilet Pt Will Perform Toileting - Clothing Manipulation and hygiene: with modified independence;sit to/from stand Pt/caregiver will Perform Home Exercise Program: Increased strength;Both right and left upper extremity;With written HEP provided Additional ADL Goal #1: Pt will independently verbalize 3 energy conservation strategies  Plan Discharge plan remains appropriate    Co-evaluation                 AM-PAC OT "6 Clicks" Daily Activity     Outcome Measure   Help from another person eating meals?: None Help from another person taking care of personal grooming?: None Help from another person toileting, which includes using toliet, bedpan, or urinal?: None Help from another person bathing (including washing, rinsing, drying)?: None Help from another  person to put on and taking off regular upper body clothing?: None Help from another person to put on and taking off regular lower body clothing?: None 6 Click Score: 24    End of Session Equipment Utilized During Treatment: Rolling walker  OT Visit Diagnosis: Unsteadiness on feet (R26.81);Muscle weakness (generalized) (M62.81)   Activity Tolerance Patient tolerated treatment well   Patient Left in bed;with call bell/phone within reach   Nurse Communication Other (comment)(pt wanting to DC)        Time: 1045-1100 OT Time Calculation (min): 15 min  Charges: OT General Charges $OT Visit: 1 Visit OT Treatments $Self Care/Home Management : 8-22 mins  Luisa Dago, OT/L   Acute OT Clinical Specialist Acute Rehabilitation Services Pager [phone] Office [phone]    [facility] 07/19/2018, 12:12 PM

## 2018-07-19 NOTE — TOC Transition Note (Addendum)
Transition of Care [facility]) - CM/SW Discharge Note   Patient Details  Name: Brent Ho MRN: [id number] Date of Birth: Aug 08, 1945  Transition of Care [facility]) CM/SW Contact:  Colleen Can RN, BSN, NCM-BC, ACM-RN [phone] (working remotely) Phone Number: 07/19/2018, 10:17 AM   Clinical Narrative:    73 yo male presented with fever; dx with acute hypoxic respiratory failure with COVID-19. CM consult acknowledged; CM contacted patient via phone to discuss the POC. Patient stated living at home with his spouse and being independent with his ADLs PTA with no DME in use. CM discuss PT/OT recommendations for HHPT/OT & RW/BSC, with patient agreeable to all recommendations except for the [facility]. CMS [facility] Compare List discussed with preference [facility] HH and [facility]. Patient indicated his spouse will provide transportation home. No further needs from CM.   Final next level of care: Home w Home Health Services Barriers to Discharge: No Barriers Identified   Patient Goals and CMS Choice Patient states their goals for this hospitalization and ongoing recovery are:: "to go home" CMS Medicare.gov Compare Post Acute Care list provided to:: Patient Choice offered to / list presented to : Patient   Discharge Plan and Services   Discharge Planning Services: CM Consult Post Acute Care Choice: Durable Medical Equipment, Home Health          DME Arranged: Walker rolling DME Agency: [facility] Date DME Agency Contacted: 07/19/18 Time DME Agency Contacted: 1010 Representative spoke with at DME Agency: Christeen Douglas (the [place] will provide the DME from the equipment closet) HH Arranged: PT, OT HH Agency: [facility] Date HH Agency Contacted: 07/19/18 Time HH Agency Contacted: 1156 Representative spoke with at [facility] Agency: Dara Hoyer

## 2018-07-19 NOTE — Discharge Instructions (Signed)
Person Under Monitoring Name: Brent Ho  Location: [address] [place] [address]   Infection Prevention Recommendations for Individuals Confirmed to have, or Being Evaluated for, 2019 Novel Coronavirus (COVID-19) Infection Who Receive Care at Home  Individuals who are confirmed to have, or are being evaluated for, COVID-19 should follow the prevention steps below until a healthcare provider or local or state health department says they can return to normal activities.  Stay home except to get medical care You should restrict activities outside your home, except for getting medical care. Do not go to work, school, or public areas, and do not use public transportation or taxis.  Call ahead before visiting your doctor Before your medical appointment, call the healthcare provider and tell them that you have, or are being evaluated for, COVID-19 infection. This will help the healthcare providers office take steps to keep other people from getting infected. Ask your healthcare provider to call the local or state health department.  Monitor your symptoms Seek prompt medical attention if your illness is worsening (e.g., difficulty breathing). Before going to your medical appointment, call the healthcare provider and tell them that you have, or are being evaluated for, COVID-19 infection. Ask your healthcare provider to call the local or state health department.  Wear a facemask You should wear a facemask that covers your nose and mouth when you are in the same room with other people and when you visit a healthcare provider. People who live with or visit you should also wear a facemask while they are in the same room with you.  Separate yourself from other people in your home As much as possible, you should stay in a different room from other people in your home. Also, you should use a separate bathroom, if available.  Avoid sharing household items You should not share  dishes, drinking glasses, cups, eating utensils, towels, bedding, or other items with other people in your home. After using these items, you should wash them thoroughly with soap and water.  Cover your coughs and sneezes Cover your mouth and nose with a tissue when you cough or sneeze, or you can cough or sneeze into your sleeve. Throw used tissues in a lined trash can, and immediately wash your hands with soap and water for at least 20 seconds or use an alcohol-based hand rub.  Wash your [employer] your hands often and thoroughly with soap and water for at least 20 seconds. You can use an alcohol-based hand sanitizer if soap and water are not available and if your hands are not visibly dirty. Avoid touching your eyes, nose, and mouth with unwashed hands.   Prevention Steps for Caregivers and Household Members of Individuals Confirmed to have, or Being Evaluated for, COVID-19 Infection Being Cared for in the Home  If you live with, or provide care at home for, a person confirmed to have, or being evaluated for, COVID-19 infection please follow these guidelines to prevent infection:  Follow healthcare providers instructions Make sure that you understand and can help the patient follow any healthcare provider instructions for all care.  Provide for the patients basic needs You should help the patient with basic needs in the home and provide support for getting groceries, prescriptions, and other personal needs.  Monitor the patients symptoms If they are getting sicker, call his or her medical provider and tell them that the patient has, or is being evaluated for, COVID-19 infection. This will help the healthcare providers office  take steps to keep other people from getting infected. Ask the healthcare provider to call the local or state health department.  Limit the number of people who have contact with the patient  If possible, have only one caregiver for the patient.  Other  household members should stay in another home or place of residence. If this is not possible, they should stay  in another room, or be separated from the patient as much as possible. Use a separate bathroom, if available.  Restrict visitors who do not have an essential need to be in the home.  Keep older adults, very young children, and other sick people away from the patient Keep older adults, very young children, and those who have compromised immune systems or chronic health conditions away from the patient. This includes people with chronic heart, lung, or kidney conditions, diabetes, and cancer.  Ensure good ventilation Make sure that shared spaces in the home have good air flow, such as from an air conditioner or an opened window, weather permitting.  Wash your hands often  Wash your hands often and thoroughly with soap and water for at least 20 seconds. You can use an alcohol based hand sanitizer if soap and water are not available and if your hands are not visibly dirty.  Avoid touching your eyes, nose, and mouth with unwashed hands.  Use disposable paper towels to dry your hands. If not available, use dedicated cloth towels and replace them when they become wet.  Wear a facemask and gloves  Wear a disposable facemask at all times in the room and gloves when you touch or have contact with the patients blood, body fluids, and/or secretions or excretions, such as sweat, saliva, sputum, nasal mucus, vomit, urine, or feces.  Ensure the mask fits over your nose and mouth tightly, and do not touch it during use.  Throw out disposable facemasks and gloves after using them. Do not reuse.  Wash your hands immediately after removing your facemask and gloves.  If your personal clothing becomes contaminated, carefully remove clothing and launder. Wash your hands after handling contaminated clothing.  Place all used disposable facemasks, gloves, and other waste in a lined container before  disposing them with other household waste.  Remove gloves and wash your hands immediately after handling these items.  Do not share dishes, glasses, or other household items with the patient  Avoid sharing household items. You should not share dishes, drinking glasses, cups, eating utensils, towels, bedding, or other items with a patient who is confirmed to have, or being evaluated for, COVID-19 infection.  After the person uses these items, you should wash them thoroughly with soap and water.  Wash laundry thoroughly  Immediately remove and wash clothes or bedding that have blood, body fluids, and/or secretions or excretions, such as sweat, saliva, sputum, nasal mucus, vomit, urine, or feces, on them.  Wear gloves when handling laundry from the patient.  Read and follow directions on labels of laundry or clothing items and detergent. In general, wash and dry with the warmest temperatures recommended on the label.  Clean all areas the individual has used often  Clean all touchable surfaces, such as counters, tabletops, doorknobs, bathroom fixtures, toilets, phones, keyboards, tablets, and bedside tables, every day. Also, clean any surfaces that may have blood, body fluids, and/or secretions or excretions on them.  Wear gloves when cleaning surfaces the patient has come in contact with.  Use a diluted bleach solution (e.g., dilute bleach with 1 part  bleach and 10 parts water) or a household disinfectant with a label that says EPA-registered for coronaviruses. To make a bleach solution at home, add 1 tablespoon of bleach to 1 quart (4 cups) of water. For a larger supply, add  cup of bleach to 1 gallon (16 cups) of water.  Read labels of cleaning products and follow recommendations provided on product labels. Labels contain instructions for safe and effective use of the cleaning product including precautions you should take when applying the product, such as wearing gloves or eye protection  and making sure you have good ventilation during use of the product.  Remove gloves and wash hands immediately after cleaning.  Monitor yourself for signs and symptoms of illness Caregivers and household members are considered close contacts, should monitor their health, and will be asked to limit movement outside of the home to the extent possible. Follow the monitoring steps for close contacts listed on the symptom monitoring form.   ? If you have additional questions, contact your local health department or call the epidemiologist on call at [phone] (available 24/7). ? This guidance is subject to change. For the most up-to-date guidance from [facility], please refer to their website: YouBlogs.pl

## 2018-07-19 NOTE — Discharge Summary (Signed)
Beauford Roerig, is a 73 y.o. male  DOB [birth year]  MRN [id number].  Admission date:  07/12/2018  Admitting Physician  Lorretta Harp, MD  Discharge Date:  07/19/2018   Primary MD  Angelica Chessman, MD  Recommendations for primary care physician for things to follow:  -Please check CBC, BMP during next visit -please  repeat QuantiFERON as an outpatient as it was undetermined during hospital stay -Patient was given [facility] Health infection prevention recommendations for COVID-19 and instructed to follow   Admission Diagnosis  Acute respiratory disease due to COVID-19 virus [U07.1, J06.9]   Discharge Diagnosis  Acute respiratory disease due to COVID-19 virus [U07.1, J06.9]    Principal Problem:   COVID-19 virus infection Active Problems:   Hypercholesteremia   Hypertension   Sepsis (HCC)   Acute respiratory failure with hypoxia (HCC)   Depression      Past Medical History:  Diagnosis Date   Anxiety    Hypercholesteremia    Hypertension     Past Surgical History:  Procedure Laterality Date   BUNIONECTOMY         History of present illness and  Hospital Course:     Kindly see H&P for history of present illness and admission details, please review complete Labs, Consult reports and Test reports for all details in brief  HPI  from the history and physical done on the day of admission 07/12/2018  HPI: Cristain Galeno is a 73 y.o. male with medical history significant of hypertension, hyperlipidemia, depression, anxiety, who presents with fever, chills, cough, shortness of breath.  Patient states that his symptoms has been going on for more than 8 days, including fever, chills, dry cough cough, shortness of breath.  Patient was seen in the ED on 4/10, and had chest x-ray which showed mild patchy infiltration, and diagnosed as community-acquired pneumonia.  Patient was given  prescription for antibiotics.  Patient states that he finished a course of azithromycin, and then started taking Vantin on 4/11, but he only took it until 4/15.  Patient states that he continues to have fever, chills, dry cough and shortness of breath, which have been progressively worsening. Denies chest pain.  No runny nose or sore throat.  He has some diarrhea several days ago, which has resolved.  Currently patient does not have nausea, vomiting, diarrhea, abdominal pain.  Denies symptoms of UTI or unilateral weakness. He denies sick contacts with COVID-19 positive personal.   ED Course: pt was found to have positive COVID 19 test, WBC 9.4, lymphopenia lactic acid 1.5, renal function normal, LDH 436, CRP 3.8, abnormal liver function (ALP 35, AST 57, ALT 38, total bilirubin 1.1), d-dimer 2.66, procalcitonin less than 0.1, ferritin 1247, triglyceride 102, pending fibrinogen, BNP, troponin, IL-6, HIV antibody and G6PD. temperature one 1.3, heart rate 90s, tachypnea, oxygen saturation 87% on room air, which improved to 93 to 95% on 2-3 L nasal cannula oxygen.  Chest x-ray is negative for infiltration today.  Patient is admitted to stepdown as inpatient.  PCCM, Dr. Darrick Penna was consulted by EDP.  Hospital Course  73 year old male with history of hypertension, hyperlipidemia, anxiety/depression who was initially evaluated at [facility] on 4/10, diagnosed with CAP and treated with antibiotics, he was discharged home, took antibiotics for 5 days but had no improvement in the symptoms, came back on 4/17, he was found to be hypoxic, tested positive for Covid as well as had lymphopenia, elevated inflammatory markers, and was admitted to the hospital.  He was initially at [facility] long and transferred to [facility] on 4/18 on 3 L supplemental oxygen, was able to wean to room air, he did not require oxygen supplement for last 24 hours.   Acute Hypoxic Resp. Failure due to Acute Covid 19 Viral Illness during the ongoing 2020  Covid 19 Pandemic -Chest x-ray done 4/21  with worsening bilateral patchy airspace disease, concerning for ARDS. -Hydroxychloroquine 4/18 >> 4/22 -Actemra times one 4/22 -Hypoxia has resolved, patient on room air over last 24 hours, -Required IV diuresis -Hypoxia has resolved, currently on room air   HTN -Continue with home meds on discharge  Obesity: BMI 35.  - Noted  Hyperlipidemia - Continue statin   Discharge Condition:  Stable   Follow UP  Follow-up Information    Angelica Chessman, MD Follow up in 2 week(s).   Specialty:  Family Medicine Contact information: [address] [place] [address] [phone]        [facility] Follow up.   Specialty:  Home Health Services Why:  Home Health Physical and occupational therapy Contact information: [address] [place] [address] [phone]        [facility] Follow up.   Why:  Dispensing optician information: [address], [place], [place]  [phone]            Discharge Instructions  and  Discharge Medications    Discharge Instructions    Discharge instructions   Complete by:  As directed    Follow with Primary MD Angelica Chessman, MD in 14 days   Get CBC, CMP, 2 view Chest X ray checked  by Primary MD next visit.    Activity: As tolerated with Full fall precautions use walker/cane & assistance as needed   Disposition Home    Diet: Heart Healthy ** , with feeding assistance and aspiration precautions.  For Heart failure patients - Check your Weight same time everyday, if you gain over 2 pounds, or you develop in leg swelling, experience more shortness of breath or chest pain, call your Primary MD immediately. Follow Cardiac Low Salt Diet and 1.5 lit/day fluid restriction.   On your next visit with your primary care physician please Get Medicines reviewed and adjusted.   Please request your Prim.MD to go over all Hospital  Tests and Procedure/Radiological results at the follow up, please get all Hospital records sent to your Prim MD by signing hospital release before you go home.   If you experience worsening of your admission symptoms, develop shortness of breath, life threatening emergency, suicidal or homicidal thoughts you must seek medical attention immediately by calling 911 or calling your MD immediately  if symptoms less severe.  You Must read complete instructions/literature along with all the possible adverse reactions/side effects for all the Medicines you take and that have been prescribed to you. Take any new Medicines after you have completely understood and accpet all the possible adverse reactions/side effects.   Do not drive, operating  heavy machinery, perform activities at heights, swimming or participation in water activities or provide baby sitting services if your were admitted for syncope or siezures until you have seen by Primary MD or a Neurologist and advised to do so again.  Do not drive when taking Pain medications.    Do not take more than prescribed Pain, Sleep and Anxiety Medications  Special Instructions: If you have smoked or chewed Tobacco  in the last 2 yrs please stop smoking, stop any regular Alcohol  and or any Recreational drug use.  Wear Seat belts while driving.   Please note  You were cared for by a hospitalist during your hospital stay. If you have any questions about your discharge medications or the care you received while you were in the hospital after you are discharged, you can call the unit and asked to speak with the hospitalist on call if the hospitalist that took care of you is not available. Once you are discharged, your primary care physician will handle any further medical issues. Please note that NO REFILLS for any discharge medications will be authorized once you are discharged, as it is imperative that you return to your primary care physician (or establish  a relationship with a primary care physician if you do not have one) for your aftercare needs so that they can reassess your need for medications and monitor your lab values.   Increase activity slowly   Complete by:  As directed    MyChart COVID-19 home monitoring program   Complete by:  Jul 19, 2018    Is the patient willing to use a smartphone for remote monitoring via the MyChart app?:  Yes   Temperature monitoring   Complete by:  Jul 19, 2018    After how many days would you like to receive a notification of this patient's flowsheet entries?:  1     Allergies as of 07/19/2018   No Known Allergies     Medication List    STOP taking these medications   azithromycin 250 MG tablet Commonly known as:  ZITHROMAX   cefpodoxime 200 MG tablet Commonly known as:  VANTIN     TAKE these medications   acetaminophen 325 MG tablet Commonly known as:  TYLENOL Take 2 tablets (650 mg total) by mouth every 6 (six) hours as needed for mild pain, fever or headache (fever >/= 101).   carboxymethylcellulose 0.5 % Soln Commonly known as:  REFRESH PLUS Place 1 drop into both eyes 3 (three) times daily as needed (dry eyes).   chlorhexidine 0.12 % solution Commonly known as:  PERIDEX by Mouth Rinse route. Swish with 1/2 a capful before bedtime and spit starting 24 hours after surgery   diazepam 5 MG tablet Commonly known as:  VALIUM Take 1 tablet (5 mg total) by mouth every 8 (eight) hours as needed (dizziness).   guaiFENesin 600 MG 12 hr tablet Commonly known as:  Mucinex Take 1 tablet (600 mg total) by mouth 2 (two) times daily.   losartan 50 MG tablet Commonly known as:  COZAAR Take 50 mg by mouth daily.   meclizine 25 MG tablet Commonly known as:  ANTIVERT Take 1 tablet (25 mg total) by mouth 3 (three) times daily as needed for dizziness.   PARoxetine 20 MG tablet Commonly known as:  PAXIL Take 20 mg by mouth daily.   simvastatin 10 MG tablet Commonly known as:  ZOCOR Take 10  mg by mouth daily.  Durable Medical Equipment  (From admission, onward)         Start     Ordered   07/19/18 0954  For home use only DME Walker rolling  Once    Question:  Patient needs a walker to treat with the following condition  Answer:  Weakness   07/19/18 0953            Diet and Activity recommendation: See Discharge Instructions above  Consults obtained -  None   Major procedures and Radiology Reports - PLEASE review detailed and final reports for all details, in brief -      Dg Chest Port 1 View  Result Date: 07/16/2018 CLINICAL DATA:  Dyspnea EXAM: PORTABLE CHEST 1 VIEW COMPARISON:  07/12/2018 FINDINGS: Patchy airspace opacities throughout both lungs have worsened. Normal heart size. Low volumes. No pneumothorax or pleural effusion. IMPRESSION: Worsening bilateral patchy airspace disease. Findings are worrisome for bilateral pneumonia, ARDS, or atypical edema. Electronically Signed   By: Jolaine Click M.D.   On: 07/16/2018 08:58   Dg Chest Port 1 View  Result Date: 07/12/2018 CLINICAL DATA:  Cough, fever EXAM: PORTABLE CHEST 1 VIEW COMPARISON:  07/05/2018 FINDINGS: The heart size and mediastinal contours are within normal limits. Both lungs are clear. The visualized skeletal structures are unremarkable. IMPRESSION: No active disease. Electronically Signed   By: Elige Ko   On: 07/12/2018 19:43   Dg Chest Port 1 View  Result Date: 07/05/2018 CLINICAL DATA:  Fever for 2 days. EXAM: PORTABLE CHEST 1 VIEW COMPARISON:  None. FINDINGS: The cardiomediastinal contours are normal. Minimal patchy opacity in the left mid lower lung zone. Pulmonary vasculature is normal. No pleural effusion or pneumothorax. No acute osseous abnormalities are seen. IMPRESSION: Minimal patchy opacity in the left mid lower lung zone is suspicious for pneumonia in the setting of fever, including atypical infection and viral pneumonia. Electronically Signed   By: Narda Rutherford  M.D.   On: 07/05/2018 22:41    Micro Results     Recent Results (from the past 240 hour(s))  SARS Coronavirus 2 [facility] order, Performed in [facility] hospital lab)     Status: Abnormal   Collection Time: 07/12/18  6:39 PM  Result Value Ref Range Status   SARS Coronavirus 2 POSITIVE (A) NEGATIVE Final    Comment: RESULT CALLED TO, READ BACK BY AND VERIFIED WITH: FARRAR,S RN  ON 07/12/2018 JACKSON,K (NOTE) If result is NEGATIVE SARS-CoV-2 target nucleic acids are NOT DETECTED. The SARS-CoV-2 RNA is generally detectable in upper and lower  respiratory specimens during the acute phase of infection. The lowest  concentration of SARS-CoV-2 viral copies this assay can detect is 250  copies / mL. A negative result does not preclude SARS-CoV-2 infection  and should not be used as the sole basis for treatment or other  patient management decisions.  A negative result may occur with  improper specimen collection / handling, submission of specimen other  than nasopharyngeal swab, presence of viral mutation(s) within the  areas targeted by this assay, and inadequate number of viral copies  (<250 copies / mL). A negative result must be combined with clinical  observations, patient history, and epidemiological information. If result is POSITIVE SARS-CoV-2 target nucleic acids are DETECTE D. The SARS-CoV-2 RNA is generally detectable in upper and lower  respiratory specimens during the acute phase of infection.  Positive  results are indicative of active infection with SARS-CoV-2.  Clinical  correlation with patient history and other diagnostic information  is  necessary to determine patient infection status.  Positive results do  not rule out bacterial infection or co-infection with other viruses. If result is PRESUMPTIVE POSTIVE SARS-CoV-2 nucleic acids MAY BE PRESENT.   A presumptive positive result was obtained on the submitted specimen  and confirmed on repeat testing.  While 2019  novel coronavirus  (SARS-CoV-2) nucleic acids may be present in the submitted sample  additional confirmatory testing may be necessary for epidemiological  and / or clinical management purposes  to differentiate between  SARS-CoV-2 and other Sarbecovirus currently known to infect humans.  If clinically indicated additional testing with an alternate test  methodology (LAB745 3) is advised. The SARS-CoV-2 RNA is generally  detectable in upper and lower respiratory specimens during the acute  phase of infection. The expected result is Negative. Fact Sheet for Patients:  BoilerBrush.com.cyhttps://www.fda.gov/media/136312/download Fact Sheet for Healthcare Providers: https://pope.com/https://www.fda.gov/media/136313/download This test is not yet approved or cleared by the [place] FDA and has been authorized for detection and/or diagnosis of SARS-CoV-2 by FDA under an Emergency Use Authorization (EUA).  This EUA will remain in effect (meaning this test can be used) for the duration of the COVID-19 declaration under Section 564(b)(1) of the Act, 21 U.S.C. section 360bbb-3(b)(1), unless the authorization is terminated or revoked sooner. Performed at [facility], [address] [place], [place] [address]   Blood Culture (routine x 2)     Status: None   Collection Time: 07/12/18  7:50 PM  Result Value Ref Range Status   Specimen Description   Final    BLOOD LEFT HAND Performed at [facility], [address] [place], [place] [address]    Special Requests   Final    BOTTLES DRAWN AEROBIC AND ANAEROBIC Blood Culture results may not be optimal due to an excessive volume of blood received in culture bottles Performed at [facility], [address] [place], [place] [address]    Culture   Final    NO GROWTH 5 DAYS Performed at [facility], [address] [place], [place] [address]    Report Status 07/17/2018 FINAL  Final  Blood Culture (routine x 2)      Status: None   Collection Time: 07/12/18  8:03 PM  Result Value Ref Range Status   Specimen Description   Final    BLOOD RIGHT HAND Performed at [facility], [address] [place], [place] [address]    Special Requests   Final    BOTTLES DRAWN AEROBIC AND ANAEROBIC Blood Culture adequate volume Performed at [facility], [address] [place], [place] [address]    Culture   Final    NO GROWTH 5 DAYS Performed at [facility], [address] [place], [place] [address]    Report Status 07/17/2018 FINAL  Final  Respiratory Panel by PCR     Status: None   Collection Time: 07/12/18  9:02 PM  Result Value Ref Range Status   Adenovirus NOT DETECTED NOT DETECTED Final   Coronavirus 229E NOT DETECTED NOT DETECTED Final    Comment: (NOTE) The Coronavirus on the Respiratory Panel, DOES NOT test for the novel  Coronavirus (2019 nCoV)    Coronavirus HKU1 NOT DETECTED NOT DETECTED Final   Coronavirus NL63 NOT DETECTED NOT DETECTED Final   Coronavirus OC43 NOT DETECTED NOT DETECTED Final   Metapneumovirus NOT DETECTED NOT DETECTED Final   Rhinovirus / Enterovirus NOT DETECTED NOT DETECTED  Final   Influenza A NOT DETECTED NOT DETECTED Final   Influenza B NOT DETECTED NOT DETECTED Final   Parainfluenza Virus 1 NOT DETECTED NOT DETECTED Final   Parainfluenza Virus 2 NOT DETECTED NOT DETECTED Final   Parainfluenza Virus 3 NOT DETECTED NOT DETECTED Final   Parainfluenza Virus 4 NOT DETECTED NOT DETECTED Final   Respiratory Syncytial Virus NOT DETECTED NOT DETECTED Final   Bordetella pertussis NOT DETECTED NOT DETECTED Final   Chlamydophila pneumoniae NOT DETECTED NOT DETECTED Final   Mycoplasma pneumoniae NOT DETECTED NOT DETECTED Final    Comment: Performed at [facility], [address], [place], [place] [address]       Today   Subjective:   Aydin Hink today has no headache,no chest abdominal pain, reports his generalized  weakness has significantly improved, he was able to ambulate today room with no hypoxia .  Objective:   Blood pressure 109/72, pulse 80, temperature 98.2 F (36.8 C), temperature source Oral, resp. rate (!) 23, height 5' 0.75" (1.543 m), weight 84.2 kg, SpO2 91 %.   Intake/Output Summary (Last 24 hours) at 07/19/2018 1007 Last data filed at 07/19/2018 0315 Gross per 24 hour  Intake --  Output 685 ml  Net -685 ml    Exam Awake Alert, Oriented x 3, No new F.N deficits, Normal affect Symmetrical Chest wall movement, Good air movement bilaterally, CTAB RRR,No Gallops,Rubs or new Murmurs, No Parasternal Heave +ve B.Sounds, Abd Soft, Non tender, No rebound -guarding or rigidity. No Cyanosis, Clubbing or edema, No new Rash or bruise  Data Review   CBC w Diff:  Lab Results  Component Value Date   WBC 5.1 07/19/2018   HGB 13.9 07/19/2018   HGB 14.5 07/13/2018   HCT 41.4 07/19/2018   PLT 382 07/19/2018   LYMPHOPCT 20 07/18/2018   BANDSPCT 8 07/15/2018   MONOPCT 6 07/18/2018   EOSPCT 6 07/18/2018   BASOPCT 2 07/18/2018    CMP:  Lab Results  Component Value Date   NA 137 07/19/2018   K 4.5 07/19/2018   CL 100 07/19/2018   CO2 27 07/19/2018   BUN 22 07/19/2018   CREATININE 0.91 07/19/2018   PROT 7.3 07/18/2018   ALBUMIN 3.0 (L) 07/18/2018   BILITOT 0.8 07/18/2018   ALKPHOS 50 07/18/2018   AST 95 (H) 07/18/2018   ALT 107 (H) 07/18/2018  .   Total Time in preparing paper work, data evaluation and todays exam - 35 minutes  Huey Bienenstock M.D on 07/19/2018 at 10:07 AM  [facility]   Office  [phone]

## 2018-07-23 ENCOUNTER — Telehealth (INDEPENDENT_AMBULATORY_CARE_PROVIDER_SITE_OTHER): Payer: Self-pay | Admitting: General Practice

## 2018-07-23 NOTE — Telephone Encounter (Signed)
Talked to pt who is feeling much better. Advised him of link texted to [employer]. He will have his wife help him when she feels better (COVID+ as well). He will see if they can get to the MyChart COVID19 Home Monitoring questionnaire. Will f/u on chart in a couple days.

## 2018-07-23 NOTE — Telephone Encounter (Signed)
Left msg on machine (wife's cell #, Kendal Hymen on [employer]) asking that pt setup mychart and login to complete MyChart COVID19 Home Monitoring questionnaire. Texted MyChart link to cell # in chart. Please transfer to me if pt/wife calls with questions [phone].

## 2018-07-25 NOTE — Telephone Encounter (Signed)
Spoke with pt wife, Kendal Hymen, regarding MyChart COVID19 Home Monitoring questionnaire. She didn't get the links. I have retexted to her and requested she sign up the pt to complete survey. Kendal Hymen doesn't feel it is necessary and states pt is doing much better.

## 2018-08-21 ENCOUNTER — Telehealth: Payer: Self-pay | Admitting: *Deleted

## 2018-08-21 NOTE — Telephone Encounter (Signed)
Called pt to ask him about donating plasma since he was positive for the COVID-19.   His wife answered the phone.   Brent Ho was on another phone call.  She asked me if she could be of help.  I explained why I was calling to see if he was interested in donating plasma since he has had the virus.  She said,  "Well, both of [place] ended up having it".  So I extended the invitation to her also to donate.  She said she would discuss it with her husband when he gets off of the phone.  She requested the information needed if they decided to donate.   I gave her the One[employer].org web site and explained about filling out the application and that someone would be in contact with them about setting up the donation if they decided to donate.  I thanked her for being willing to consider donating to help others possibly recover quicker from the COVID-19 due to their antibodies.   She thanked me for calling.

## 2018-08-21 NOTE — Telephone Encounter (Signed)
Both Mr. Brent Ho and his wife were more than 28 days out since their last COVID-19 symptoms.

## 2019-11-14 NOTE — Progress Notes (Signed)
DUE TO COVID-19 ONLY ONE VISITOR IS ALLOWED TO COME WITH YOU AND STAY IN THE WAITING ROOM ONLY DURING PRE OP AND PROCEDURE DAY OF SURGERY. THE 1 VISITOR  MAY VISIT WITH YOU AFTER SURGERY IN YOUR PRIVATE ROOM DURING VISITING HOURS ONLY!  YOU NEED TO HAVE A COVID 19 TEST ON__8/27/21 _____ @_______ , THIS TEST MUST BE DONE BEFORE SURGERY,  COVID TESTING SITE [address] [place] , IT IS ON THE RIGHT GOING OUT [address] APPROXIMATELY  2 MINUTES PAST [employer] ON THE RIGHT. ONCE YOUR COVID TEST IS COMPLETED,  PLEASE BEGIN THE QUARANTINE INSTRUCTIONS AS OUTLINED IN YOUR HANDOUT.                Brent Ho  11/14/2019   Your procedure is scheduled on:      11/25/19   Report to [facility] Main  Entrance   Report to admitting at   0730 AM     Call this number if you have problems the morning of surgery [phone]    REMEMBER: NO  SOLID FOOD CANDY OR GUM AFTER MIDNIGHT. CLEAR LIQUIDS UNTIL    0700am     . NOTHING BY MOUTH EXCEPT CLEAR LIQUIDS UNTIL  0700am . PLEASE FINISH ENSURE DRINK PER SURGEON ORDER  WHICH NEEDS TO BE COMPLETED AT     0700am .      CLEAR LIQUID DIET   Foods Allowed                                                                    Coffee and tea, regular and decaf                            Fruit ices (not with fruit pulp)                                      Iced Popsicles                                    Carbonated beverages, regular and diet                                    Cranberry, grape and apple juices Sports drinks like Gatorade Lightly seasoned clear broth or consume(fat free) Sugar, honey syrup ___________________________________________________________________      BRUSH YOUR TEETH MORNING OF SURGERY AND RINSE YOUR MOUTH OUT, NO CHEWING GUM CANDY OR MINTS.     Take these medicines the morning of surgery with A SIP OF WATER: Paxil                                 You may not have any metal on your body  including hair pins and              piercings  Do not wear jewelry, make-up, lotions, powders or perfumes, deodorant  Do not wear nail polish on your fingernails.  Do not shave  48 hours prior to surgery.              Men may shave face and neck.   Do not bring valuables to the hospital. [place].  Contacts, dentures or bridgework may not be worn into surgery.  Leave suitcase in the car. After surgery it may be brought to your room.     Patients discharged the day of surgery will not be allowed to drive home. IF YOU ARE HAVING SURGERY AND GOING HOME THE SAME DAY, YOU MUST HAVE AN ADULT TO DRIVE YOU HOME AND BE WITH YOU FOR 24 HOURS. YOU MAY GO HOME BY TAXI OR UBER OR ORTHERWISE, BUT AN ADULT MUST ACCOMPANY YOU HOME AND STAY WITH YOU FOR 24 HOURS.  Name and phone number of your driver:                Please read over the following fact sheets you were given: _____________________________________________________________________  [facility] - Preparing for Surgery Before surgery, you can play an important role.  Because skin is not sterile, your skin needs to be as free of germs as possible.  You can reduce the number of germs on your skin by washing with CHG (chlorahexidine gluconate) soap before surgery.  CHG is an antiseptic cleaner which kills germs and bonds with the skin to continue killing germs even after washing. Please DO NOT use if you have an allergy to CHG or antibacterial soaps.  If your skin becomes reddened/irritated stop using the CHG and inform your nurse when you arrive at Short Stay. Do not shave (including legs and underarms) for at least 48 hours prior to the first CHG shower.  You may shave your face/neck. Please follow these instructions carefully:  1.  Shower with CHG Soap the night before surgery and the  morning of Surgery.  2.  If you choose to wash your hair, wash your hair first as usual with your   normal  shampoo.  3.  After you shampoo, rinse your hair and body thoroughly to remove the  shampoo.                           4.  Use CHG as you would any other liquid soap.  You can apply chg directly  to the skin and wash                       Gently with a scrungie or clean washcloth.  5.  Apply the CHG Soap to your body ONLY FROM THE NECK DOWN.   Do not use on face/ open                           Wound or open sores. Avoid contact with eyes, ears mouth and genitals (private parts).                       Wash face,  Genitals (private parts) with your normal soap.             6.  Wash thoroughly, paying special attention to the area where your surgery  will be performed.  7.  Thoroughly rinse your body with warm water  from the neck down.  8.  DO NOT shower/wash with your normal soap after using and rinsing off  the CHG Soap.                9.  Pat yourself dry with a clean towel.            10.  Wear clean pajamas.            11.  Place clean sheets on your bed the night of your first shower and do not  sleep with pets. Day of Surgery : Do not apply any lotions/deodorants the morning of surgery.  Please wear clean clothes to the hospital/surgery center.  FAILURE TO FOLLOW THESE INSTRUCTIONS MAY RESULT IN THE CANCELLATION OF YOUR SURGERY PATIENT SIGNATURE_________________________________  NURSE SIGNATURE__________________________________  ________________________________________________________________________

## 2019-11-17 ENCOUNTER — Encounter (HOSPITAL_COMMUNITY)
Admission: RE | Admit: 2019-11-17 | Discharge: 2019-11-17 | Disposition: A | Discharge status: Home / Self Care | Payer: Medicare Other | Source: Ambulatory Visit | Attending: Orthopedic Surgery | Admitting: Orthopedic Surgery

## 2019-11-17 ENCOUNTER — Other Ambulatory Visit: Payer: Self-pay

## 2019-11-17 ENCOUNTER — Encounter (HOSPITAL_COMMUNITY): Payer: Self-pay

## 2019-11-17 DIAGNOSIS — Z01818 Encounter for other preprocedural examination: Secondary | ICD-10-CM | POA: Insufficient documentation

## 2019-11-17 HISTORY — DX: Pneumonia, unspecified organism: J18.9

## 2019-11-17 HISTORY — DX: Unspecified osteoarthritis, unspecified site: M19.90

## 2019-11-17 LAB — BASIC METABOLIC PANEL
Anion gap: 7 (ref 5–15)
BUN: 15 mg/dL (ref 8–23)
CO2: 27 mmol/L (ref 22–32)
Calcium: 9.3 mg/dL (ref 8.9–10.3)
Chloride: 106 mmol/L (ref 98–111)
Creatinine, Ser: 0.95 mg/dL (ref 0.61–1.24)
GFR calc Af Amer: >60 mL/min (ref >60)
GFR calc non Af Amer: >60 mL/min (ref >60)
Glucose, Bld: 114 mg/dL — ABNORMAL HIGH (ref 70–99)
Potassium: 4.5 mmol/L (ref 3.5–5.1)
Sodium: 140 mmol/L (ref 135–145)

## 2019-11-17 LAB — CBC
HCT: 43.8 % (ref 39.0–52.0)
Hemoglobin: 14.8 g/dL (ref 13.0–17.0)
MCH: 32 pg (ref 26.0–34.0)
MCHC: 33.8 g/dL (ref 30.0–36.0)
MCV: 94.6 fL (ref 80.0–100.0)
Platelets: 175 10*3/uL (ref 150–400)
RBC: 4.63 MIL/uL (ref 4.22–5.81)
RDW: 12.2 % (ref 11.5–15.5)
WBC: 5.4 10*3/uL (ref 4.0–10.5)
nRBC: 0 % (ref 0.0–0.2)

## 2019-11-17 LAB — SURGICAL PCR SCREEN
MRSA, PCR: NEGATIVE
Staphylococcus aureus: NEGATIVE

## 2019-11-18 NOTE — Progress Notes (Signed)
Called Dr Riley Nearing office and rquested 12 lead ekg tracing done 10/28/19 at last visit.   They are to fax.

## 2019-11-18 NOTE — H&P (Signed)
TOTAL KNEE ADMISSION H&P  Patient is being admitted for right total knee arthroplasty.  Subjective:  Chief Complaint:    Right knee OA / pain  HPI: Brent Ho, 74 y.o. male, has a history of pain and functional disability in the right knee due to arthritis and has failed non-surgical conservative treatments for greater than 12 weeks to include NSAID's and/or analgesics, corticosteriod injections, viscosupplementation injections and activity modification.  Onset of symptoms was gradual, starting 2+ years ago with gradually worsening course since that time. The patient noted no past surgery on the right knee(s).  Patient currently rates pain in the right knee(s) at 7 out of 10 with activity. Patient has worsening of pain with activity and weight bearing, pain that interferes with activities of daily living, pain with passive range of motion, crepitus and joint swelling.  Patient has evidence of periarticular osteophytes and joint space narrowing by imaging studies.  There is no active infection.  Risks, benefits and expectations were discussed with the patient.  Risks including but not limited to the risk of anesthesia, blood clots, nerve damage, blood vessel damage, failure of the prosthesis, infection and up to and including death.  Patient understand the risks, benefits and expectations and wishes to proceed with surgery.   PCP: Angelica Chessman, MD  D/C Plans:       Home (ambulatory)  Post-op Meds:       No Rx given  Tranexamic Acid:      To be given - IV   Decadron:      Is to be given  FYI:      ASA  Norco  DME:   Pt already has equipment   PT:   OPPT  Pharmacy: [facility]    Patient Active Problem List   Diagnosis Date Noted  . Sepsis (HCC) 07/12/2018  . COVID-19 virus infection 07/12/2018  . Acute respiratory failure with hypoxia (HCC) 07/12/2018  . Depression 07/12/2018  . Hypercholesteremia   . Hypertension   . Acute respiratory disease due to COVID-19 virus     Past Medical History:  Diagnosis Date  . Anxiety   . Arthritis   . Hypercholesteremia   . Hypertension   . Pneumonia    hx of     Past Surgical History:  Procedure Laterality Date  . BUNIONECTOMY      No current facility-administered medications for this encounter.   Current Outpatient Medications  Medication Sig Dispense Refill Last Dose  . Apoaequorin (PREVAGEN PO) Take 1 capsule by mouth daily.     Marland Kitchen losartan (COZAAR) 50 MG tablet Take 50 mg by mouth daily.     Marland Kitchen PARoxetine (PAXIL) 20 MG tablet Take 20 mg by mouth daily.     . Probiotic Product (ALIGN PO) Take 1 capsule by mouth daily.     . simvastatin (ZOCOR) 10 MG tablet Take 10 mg by mouth daily.     Marland Kitchen acetaminophen (TYLENOL) 325 MG tablet Take 2 tablets (650 mg total) by mouth every 6 (six) hours as needed for mild pain, fever or headache (fever >/= 101). (Patient not taking: Reported on 11/10/2019)   Not Taking at Unknown time  . carboxymethylcellulose (REFRESH PLUS) 0.5 % SOLN Place 1 drop into both eyes 3 (three) times daily as needed (dry eyes). (Patient not taking: Reported on 11/10/2019)   Not Taking at Unknown time  . chlorhexidine (PERIDEX) 0.12 % solution by Mouth Rinse route. Swish with 1/2 a capful before bedtime and  spit starting 24 hours after surgery (Patient not taking: Reported on 11/10/2019)  4 Not Taking at Unknown time  . diazepam (VALIUM) 5 MG tablet Take 1 tablet (5 mg total) by mouth every 8 (eight) hours as needed (dizziness). (Patient not taking: Reported on 07/12/2018) 12 tablet 0   . meclizine (ANTIVERT) 25 MG tablet Take 1 tablet (25 mg total) by mouth 3 (three) times daily as needed for dizziness. (Patient not taking: Reported on 07/12/2018) 15 tablet 0    No Known Allergies   Social History   Tobacco Use  . Smoking status: Former Games developer  . Smokeless tobacco: Never Used  Substance Use Topics  . Alcohol use: Yes    Comment: social       Review of Systems  Constitutional: Negative.   HENT:  Negative.   Eyes: Negative.   Respiratory: Negative.   Cardiovascular: Negative.   Gastrointestinal: Negative.   Genitourinary: Negative.   Musculoskeletal: Positive for joint pain.  Skin: Negative.   Neurological: Negative.   Endo/Heme/Allergies: Negative.   Psychiatric/Behavioral: Positive for depression. The patient is nervous/anxious.       Objective:  Physical Exam Constitutional:      Appearance: He is well-developed.  HENT:     Head: Normocephalic.  Eyes:     Pupils: Pupils are equal, round, and reactive to light.  Neck:     Thyroid: No thyromegaly.     Vascular: No JVD.     Trachea: No tracheal deviation.  Cardiovascular:     Rate and Rhythm: Normal rate and regular rhythm.  Pulmonary:     Effort: Pulmonary effort is normal. No respiratory distress.     Breath sounds: Normal breath sounds. No wheezing.  Abdominal:     Palpations: Abdomen is soft.     Tenderness: There is no abdominal tenderness. There is no guarding.  Musculoskeletal:     Cervical back: Neck supple.     Right knee: Swelling and bony tenderness present. No erythema or ecchymosis. Decreased range of motion. Tenderness present.  Lymphadenopathy:     Cervical: No cervical adenopathy.  Skin:    General: Skin is warm and dry.  Neurological:     Mental Status: He is alert and oriented to person, place, and time.       Labs:  Estimated body mass index is 31.64 kg/m as calculated from the following:   Height as of 11/17/19: 5\' 7"  (1.702 m).   Weight as of 11/17/19: 91.6 kg.   Imaging Review Plain radiographs demonstrate severe degenerative joint disease of the right knee.  The bone quality appears to be good for age and reported activity level.      Assessment/Plan:  End stage arthritis, right knee   The patient history, physical examination, clinical judgment of the provider and imaging studies are consistent with end stage degenerative joint disease of the right knee and total knee  arthroplasty is deemed medically necessary. The treatment options including medical management, injection therapy arthroscopy and arthroplasty were discussed at length. The risks and benefits of total knee arthroplasty were presented and reviewed. The risks due to aseptic loosening, infection, stiffness, patella tracking problems, thromboembolic complications and other imponderables were discussed. The patient acknowledged the explanation, agreed to proceed with the plan and consent was signed. Patient is being admitted for inpatient treatment for surgery, pain control, PT, OT, prophylactic antibiotics, VTE prophylaxis, progressive ambulation and ADL's and discharge planning. The patient is planning to be discharged home.     Patient's  anticipated LOS is less than 2 midnights, meeting these requirements: - Lives within 1 hour of care - Has a competent adult at home to recover with post-op recover - NO history of  - Chronic pain requiring opiods  - Diabetes  - Coronary Artery Disease  - Heart failure  - Heart attack  - Stroke  - DVT/VTE  - Cardiac arrhythmia  - Respiratory Failure/COPD  - Renal failure  - Anemia  - Advanced Liver disease    Anastasio Auerbach. Sefora Tietje   PA-C  11/18/2019, 11:03 AM

## 2019-11-19 NOTE — Progress Notes (Signed)
Anesthesia Review:  PCP: Dr Riley Nearing - LOV- 10/28/19 - epic  Cardiologist : Chest x-ray : EKG : 10/28/19 on chart  Echo : Stress test: Cardiac Cath :  Activity level: can do a flight of stairs without difficulty  Sleep Study/ CPAP : Fasting Blood Sugar :      / Checks Blood Sugar -- times a day:   Blood Thinner/ Instructions /Last Dose: ASA / Instructions/ Last Dose :

## 2019-11-21 ENCOUNTER — Other Ambulatory Visit (HOSPITAL_COMMUNITY)
Admission: RE | Admit: 2019-11-21 | Discharge: 2019-11-21 | Disposition: A | Discharge status: Home / Self Care | Payer: Medicare Other | Source: Ambulatory Visit | Attending: Orthopedic Surgery | Admitting: Orthopedic Surgery

## 2019-11-21 DIAGNOSIS — Z20822 Contact with and (suspected) exposure to covid-19: Secondary | ICD-10-CM | POA: Insufficient documentation

## 2019-11-21 DIAGNOSIS — Z01812 Encounter for preprocedural laboratory examination: Secondary | ICD-10-CM | POA: Insufficient documentation

## 2019-11-21 LAB — SARS CORONAVIRUS 2 (TAT 6-24 HRS): SARS Coronavirus 2: NEGATIVE

## 2019-11-25 ENCOUNTER — Ambulatory Visit (HOSPITAL_COMMUNITY): Payer: Medicare Other | Admitting: Physician Assistant

## 2019-11-25 ENCOUNTER — Encounter (HOSPITAL_COMMUNITY)
Admission: RE | Disposition: A | Discharge status: Home / Self Care | Payer: Self-pay | Source: Ambulatory Visit | Attending: Orthopedic Surgery

## 2019-11-25 ENCOUNTER — Ambulatory Visit (HOSPITAL_COMMUNITY)
Admission: RE | Admit: 2019-11-25 | Discharge: 2019-11-25 | Disposition: A | Discharge status: Home / Self Care | Payer: Medicare Other | Source: Ambulatory Visit | Attending: Orthopedic Surgery | Admitting: Orthopedic Surgery

## 2019-11-25 ENCOUNTER — Encounter (HOSPITAL_COMMUNITY): Payer: Self-pay | Admitting: Orthopedic Surgery

## 2019-11-25 ENCOUNTER — Other Ambulatory Visit: Payer: Self-pay

## 2019-11-25 ENCOUNTER — Ambulatory Visit (HOSPITAL_COMMUNITY): Payer: Medicare Other | Admitting: Anesthesiology

## 2019-11-25 DIAGNOSIS — F329 Major depressive disorder, single episode, unspecified: Secondary | ICD-10-CM | POA: Diagnosis not present

## 2019-11-25 DIAGNOSIS — F419 Anxiety disorder, unspecified: Secondary | ICD-10-CM | POA: Diagnosis not present

## 2019-11-25 DIAGNOSIS — I110 Hypertensive heart disease with heart failure: Secondary | ICD-10-CM | POA: Insufficient documentation

## 2019-11-25 DIAGNOSIS — Z8619 Personal history of other infectious and parasitic diseases: Secondary | ICD-10-CM | POA: Diagnosis not present

## 2019-11-25 DIAGNOSIS — M1711 Unilateral primary osteoarthritis, right knee: Secondary | ICD-10-CM | POA: Insufficient documentation

## 2019-11-25 DIAGNOSIS — Z87891 Personal history of nicotine dependence: Secondary | ICD-10-CM | POA: Diagnosis not present

## 2019-11-25 DIAGNOSIS — Z96651 Presence of right artificial knee joint: Secondary | ICD-10-CM

## 2019-11-25 DIAGNOSIS — Z79899 Other long term (current) drug therapy: Secondary | ICD-10-CM | POA: Diagnosis not present

## 2019-11-25 DIAGNOSIS — E7800 Pure hypercholesterolemia, unspecified: Secondary | ICD-10-CM | POA: Insufficient documentation

## 2019-11-25 DIAGNOSIS — M1990 Unspecified osteoarthritis, unspecified site: Secondary | ICD-10-CM | POA: Diagnosis not present

## 2019-11-25 DIAGNOSIS — Z8616 Personal history of COVID-19: Secondary | ICD-10-CM | POA: Diagnosis not present

## 2019-11-25 DIAGNOSIS — I509 Heart failure, unspecified: Secondary | ICD-10-CM | POA: Diagnosis not present

## 2019-11-25 HISTORY — PX: TOTAL KNEE ARTHROPLASTY: SHX125

## 2019-11-25 LAB — TYPE AND SCREEN
ABO/RH(D): A POS
Antibody Screen: NEGATIVE

## 2019-11-25 SURGERY — ARTHROPLASTY, KNEE, TOTAL
Anesthesia: Monitor Anesthesia Care | Site: Knee | Laterality: Right

## 2019-11-25 MED ORDER — SODIUM CHLORIDE (PF) 0.9 % IJ SOLN
INTRAMUSCULAR | Status: AC
  Filled 2019-11-25: qty 50

## 2019-11-25 MED ORDER — LACTATED RINGERS IV SOLN
INTRAVENOUS | Status: DC
  Administered 2019-11-25: 1000 mL via INTRAVENOUS

## 2019-11-25 MED ORDER — CEFAZOLIN SODIUM-DEXTROSE 2-4 GM/100ML-% IV SOLN
2.0000 g | INTRAVENOUS | Status: AC
  Administered 2019-11-25: 2 g via INTRAVENOUS
  Filled 2019-11-25: qty 100

## 2019-11-25 MED ORDER — ONDANSETRON HCL 4 MG/2ML IJ SOLN: 4.0000 mg | Freq: Once | INTRAMUSCULAR | Status: DC | PRN

## 2019-11-25 MED ORDER — PROPOFOL 500 MG/50ML IV EMUL
INTRAVENOUS | Status: DC | PRN
  Administered 2019-11-25: 75 ug/kg/min via INTRAVENOUS

## 2019-11-25 MED ORDER — ONDANSETRON HCL 4 MG/2ML IJ SOLN
INTRAMUSCULAR | Status: AC
  Filled 2019-11-25: qty 2

## 2019-11-25 MED ORDER — TRANEXAMIC ACID-NACL 1000-0.7 MG/100ML-% IV SOLN: 1000.0000 mg | Freq: Once | INTRAVENOUS | Status: DC

## 2019-11-25 MED ORDER — PHENYLEPHRINE 40 MCG/ML (10ML) SYRINGE FOR IV PUSH (FOR BLOOD PRESSURE SUPPORT)
PREFILLED_SYRINGE | INTRAVENOUS | Status: DC | PRN
  Administered 2019-11-25: 80 ug via INTRAVENOUS

## 2019-11-25 MED ORDER — HYDROCODONE-ACETAMINOPHEN 5-325 MG PO TABS: 1.0000 | ORAL_TABLET | ORAL | Status: DC | PRN

## 2019-11-25 MED ORDER — STERILE WATER FOR IRRIGATION IR SOLN
Status: DC | PRN
  Administered 2019-11-25: 1000 mL

## 2019-11-25 MED ORDER — METHOCARBAMOL 500 MG IVPB - SIMPLE MED: 500.0000 mg | Freq: Four times a day (QID) | INTRAVENOUS | Status: DC | PRN

## 2019-11-25 MED ORDER — FENTANYL CITRATE (PF) 100 MCG/2ML IJ SOLN
50.0000 ug | INTRAMUSCULAR | Status: DC
  Administered 2019-11-25: 50 ug via INTRAVENOUS
  Filled 2019-11-25: qty 2

## 2019-11-25 MED ORDER — ASPIRIN 81 MG PO CHEW: 81.0000 mg | CHEWABLE_TABLET | Freq: Two times a day (BID) | ORAL | 0 refills | Status: AC

## 2019-11-25 MED ORDER — LACTATED RINGERS IV BOLUS
250.0000 mL | Freq: Once | INTRAVENOUS | Status: AC
  Administered 2019-11-25: 250 mL via INTRAVENOUS

## 2019-11-25 MED ORDER — OXYCODONE HCL 5 MG/5ML PO SOLN: 5.0000 mg | Freq: Once | ORAL | Status: AC | PRN

## 2019-11-25 MED ORDER — ORAL CARE MOUTH RINSE: 15.0000 mL | Freq: Once | OROMUCOSAL | Status: AC

## 2019-11-25 MED ORDER — CHLORHEXIDINE GLUCONATE 0.12 % MT SOLN
15.0000 mL | Freq: Once | OROMUCOSAL | Status: AC
  Administered 2019-11-25: 15 mL via OROMUCOSAL

## 2019-11-25 MED ORDER — CEFAZOLIN SODIUM-DEXTROSE 2-4 GM/100ML-% IV SOLN
2.0000 g | Freq: Four times a day (QID) | INTRAVENOUS | Status: DC
  Administered 2019-11-25: 2 g via INTRAVENOUS
  Filled 2019-11-25: qty 100

## 2019-11-25 MED ORDER — HYDROCODONE-ACETAMINOPHEN 7.5-325 MG PO TABS: 1.0000 | ORAL_TABLET | ORAL | 0 refills | Status: AC | PRN

## 2019-11-25 MED ORDER — LACTATED RINGERS IV BOLUS
500.0000 mL | Freq: Once | INTRAVENOUS | Status: AC
  Administered 2019-11-25: 500 mL via INTRAVENOUS

## 2019-11-25 MED ORDER — POLYETHYLENE GLYCOL 3350 17 G PO PACK: 17.0000 g | PACK | Freq: Two times a day (BID) | ORAL | 0 refills | Status: AC

## 2019-11-25 MED ORDER — OXYCODONE HCL 5 MG PO TABS
5.0000 mg | ORAL_TABLET | Freq: Once | ORAL | Status: AC | PRN
  Administered 2019-11-25: 5 mg via ORAL

## 2019-11-25 MED ORDER — METHOCARBAMOL 500 MG PO TABS: 500.0000 mg | ORAL_TABLET | Freq: Four times a day (QID) | ORAL | Status: DC | PRN

## 2019-11-25 MED ORDER — KETOROLAC TROMETHAMINE 30 MG/ML IJ SOLN
INTRAMUSCULAR | Status: AC
  Filled 2019-11-25: qty 1

## 2019-11-25 MED ORDER — EPHEDRINE SULFATE-NACL 50-0.9 MG/10ML-% IV SOSY
PREFILLED_SYRINGE | INTRAVENOUS | Status: DC | PRN
  Administered 2019-11-25: 10 mg via INTRAVENOUS
  Administered 2019-11-25 (×4): 5 mg via INTRAVENOUS

## 2019-11-25 MED ORDER — MIDAZOLAM HCL 2 MG/2ML IJ SOLN
1.0000 mg | INTRAMUSCULAR | Status: DC
  Administered 2019-11-25: 1 mg via INTRAVENOUS
  Filled 2019-11-25: qty 2

## 2019-11-25 MED ORDER — MEPIVACAINE HCL (PF) 2 % IJ SOLN
INTRAMUSCULAR | Status: DC | PRN
  Administered 2019-11-25: 3.5 mL via INTRATHECAL

## 2019-11-25 MED ORDER — SODIUM CHLORIDE (PF) 0.9 % IJ SOLN
INTRAMUSCULAR | Status: DC | PRN
  Administered 2019-11-25: 30 mL

## 2019-11-25 MED ORDER — TRANEXAMIC ACID-NACL 1000-0.7 MG/100ML-% IV SOLN
1000.0000 mg | INTRAVENOUS | Status: AC
  Administered 2019-11-25: 1000 mg via INTRAVENOUS
  Filled 2019-11-25: qty 100

## 2019-11-25 MED ORDER — OXYCODONE HCL 5 MG PO TABS
ORAL_TABLET | ORAL | Status: AC
  Filled 2019-11-25: qty 1

## 2019-11-25 MED ORDER — POVIDONE-IODINE 10 % EX SWAB: 2.0000 "application " | Freq: Once | CUTANEOUS | Status: DC

## 2019-11-25 MED ORDER — DEXAMETHASONE SODIUM PHOSPHATE 10 MG/ML IJ SOLN
INTRAMUSCULAR | Status: DC | PRN
  Administered 2019-11-25: 10 mg

## 2019-11-25 MED ORDER — ACETAMINOPHEN 500 MG PO TABS
1000.0000 mg | ORAL_TABLET | Freq: Once | ORAL | Status: AC
  Administered 2019-11-25: 1000 mg via ORAL
  Filled 2019-11-25: qty 2

## 2019-11-25 MED ORDER — DEXAMETHASONE SODIUM PHOSPHATE 10 MG/ML IJ SOLN
INTRAMUSCULAR | Status: AC
  Filled 2019-11-25: qty 1

## 2019-11-25 MED ORDER — PROPOFOL 1000 MG/100ML IV EMUL
INTRAVENOUS | Status: AC
  Filled 2019-11-25: qty 100

## 2019-11-25 MED ORDER — PHENYLEPHRINE 40 MCG/ML (10ML) SYRINGE FOR IV PUSH (FOR BLOOD PRESSURE SUPPORT)
PREFILLED_SYRINGE | INTRAVENOUS | Status: AC
  Filled 2019-11-25: qty 10

## 2019-11-25 MED ORDER — KETOROLAC TROMETHAMINE 30 MG/ML IJ SOLN
INTRAMUSCULAR | Status: DC | PRN
  Administered 2019-11-25: 30 mg

## 2019-11-25 MED ORDER — HYDROMORPHONE HCL 1 MG/ML IJ SOLN: 0.2500 mg | INTRAMUSCULAR | Status: DC | PRN

## 2019-11-25 MED ORDER — 0.9 % SODIUM CHLORIDE (POUR BTL) OPTIME
TOPICAL | Status: DC | PRN
  Administered 2019-11-25: 1000 mL

## 2019-11-25 MED ORDER — EPHEDRINE 5 MG/ML INJ
INTRAVENOUS | Status: AC
  Filled 2019-11-25: qty 10

## 2019-11-25 MED ORDER — DEXAMETHASONE SODIUM PHOSPHATE 10 MG/ML IJ SOLN
10.0000 mg | Freq: Once | INTRAMUSCULAR | Status: AC
  Administered 2019-11-25: 10 mg via INTRAVENOUS

## 2019-11-25 MED ORDER — ONDANSETRON HCL 4 MG/2ML IJ SOLN
INTRAMUSCULAR | Status: DC | PRN
  Administered 2019-11-25: 4 mg via INTRAVENOUS

## 2019-11-25 MED ORDER — BUPIVACAINE-EPINEPHRINE (PF) 0.25% -1:200000 IJ SOLN
INTRAMUSCULAR | Status: DC | PRN
  Administered 2019-11-25: 30 mL via PERINEURAL

## 2019-11-25 MED ORDER — PROPOFOL 10 MG/ML IV BOLUS
INTRAVENOUS | Status: DC | PRN
  Administered 2019-11-25 (×3): 10 mg via INTRAVENOUS

## 2019-11-25 MED ORDER — PHENYLEPHRINE HCL (PRESSORS) 10 MG/ML IV SOLN
INTRAVENOUS | Status: AC
  Filled 2019-11-25: qty 1

## 2019-11-25 MED ORDER — BUPIVACAINE-EPINEPHRINE (PF) 0.25% -1:200000 IJ SOLN
INTRAMUSCULAR | Status: AC
  Filled 2019-11-25: qty 30

## 2019-11-25 MED ORDER — METHOCARBAMOL 500 MG PO TABS: 500.0000 mg | ORAL_TABLET | Freq: Four times a day (QID) | ORAL | 0 refills | Status: AC | PRN

## 2019-11-25 MED ORDER — DOCUSATE SODIUM 100 MG PO CAPS: 100.0000 mg | ORAL_CAPSULE | Freq: Two times a day (BID) | ORAL | Status: AC

## 2019-11-25 MED ORDER — ROPIVACAINE HCL 5 MG/ML IJ SOLN
INTRAMUSCULAR | Status: DC | PRN
  Administered 2019-11-25: 30 mL via PERINEURAL

## 2019-11-25 MED ORDER — LIDOCAINE HCL (CARDIAC) PF 100 MG/5ML IV SOSY
PREFILLED_SYRINGE | INTRAVENOUS | Status: DC | PRN
  Administered 2019-11-25: 20 mg via INTRAVENOUS

## 2019-11-25 SURGICAL SUPPLY — 65 items
ADH SKN CLS APL DERMABOND .7 (GAUZE/BANDAGES/DRESSINGS) ×1
ATTUNE MED ANAT PAT 38 KNEE (Knees) ×2 IMPLANT
ATTUNE PS FEM RT SZ 6 CEM KNEE (Femur) ×2 IMPLANT
ATTUNE PSRP INSR SZ6 7 KNEE (Insert) ×2 IMPLANT
BAG SPEC THK2 15X12 ZIP CLS (MISCELLANEOUS)
BAG ZIPLOCK 12X15 (MISCELLANEOUS) IMPLANT
BASE TIBIA ATTUNE KNEE SYS SZ6 (Knees) ×1 IMPLANT
BLADE SAW SGTL 11.0X1.19X90.0M (BLADE) IMPLANT
BLADE SAW SGTL 13.0X1.19X90.0M (BLADE) ×2 IMPLANT
BLADE SURG SZ10 CARB STEEL (BLADE) ×4 IMPLANT
BNDG CMPR MED 10X6 ELC LF (GAUZE/BANDAGES/DRESSINGS) ×1
BNDG ELASTIC 6X10 VLCR STRL LF (GAUZE/BANDAGES/DRESSINGS) ×2 IMPLANT
BNDG ELASTIC 6X5.8 VLCR STR LF (GAUZE/BANDAGES/DRESSINGS) ×2 IMPLANT
BOWL SMART MIX CTS (DISPOSABLE) ×2 IMPLANT
BSPLAT TIB 6 CMNT ROT PLAT STR (Knees) ×1 IMPLANT
CEMENT HV SMART SET (Cement) ×4 IMPLANT
COVER SURGICAL LIGHT HANDLE (MISCELLANEOUS) ×2 IMPLANT
COVER WAND RF STERILE (DRAPES) IMPLANT
CUFF TOURN SGL QUICK 34 (TOURNIQUET CUFF) ×2
CUFF TRNQT CYL 34X4.125X (TOURNIQUET CUFF) ×1 IMPLANT
DECANTER SPIKE VIAL GLASS SM (MISCELLANEOUS) ×4 IMPLANT
DERMABOND ADVANCED (GAUZE/BANDAGES/DRESSINGS) ×1
DERMABOND ADVANCED .7 DNX12 (GAUZE/BANDAGES/DRESSINGS) ×1 IMPLANT
DRAPE U-SHAPE 47X51 STRL (DRAPES) ×2 IMPLANT
DRESSING AQUACEL AG SP 3.5X10 (GAUZE/BANDAGES/DRESSINGS) ×1 IMPLANT
DRSG AQUACEL AG ADV 3.5X10 (GAUZE/BANDAGES/DRESSINGS) ×2 IMPLANT
DRSG AQUACEL AG SP 3.5X10 (GAUZE/BANDAGES/DRESSINGS) ×2
DURAPREP 26ML APPLICATOR (WOUND CARE) ×4 IMPLANT
ELECT REM PT RETURN 15FT ADLT (MISCELLANEOUS) ×2 IMPLANT
GLOVE BIO SURGEON STRL SZ 6 (GLOVE) ×2 IMPLANT
GLOVE BIOGEL PI IND STRL 6.5 (GLOVE) ×1 IMPLANT
GLOVE BIOGEL PI IND STRL 7.5 (GLOVE) ×1 IMPLANT
GLOVE BIOGEL PI IND STRL 8.5 (GLOVE) ×1 IMPLANT
GLOVE BIOGEL PI INDICATOR 6.5 (GLOVE) ×1
GLOVE BIOGEL PI INDICATOR 7.5 (GLOVE) ×1
GLOVE BIOGEL PI INDICATOR 8.5 (GLOVE) ×1
GLOVE ECLIPSE 8.0 STRL XLNG CF (GLOVE) ×2 IMPLANT
GLOVE ORTHO TXT STRL SZ7.5 (GLOVE) ×2 IMPLANT
GOWN STRL REUS W/ TWL LRG LVL3 (GOWN DISPOSABLE) ×1 IMPLANT
GOWN STRL REUS W/TWL 2XL LVL3 (GOWN DISPOSABLE) ×2 IMPLANT
GOWN STRL REUS W/TWL LRG LVL3 (GOWN DISPOSABLE) ×4 IMPLANT
HANDPIECE INTERPULSE COAX TIP (DISPOSABLE) ×2
HOLDER FOLEY CATH W/STRAP (MISCELLANEOUS) IMPLANT
KIT TURNOVER KIT A (KITS) IMPLANT
MANIFOLD NEPTUNE II (INSTRUMENTS) ×2 IMPLANT
NDL SAFETY ECLIPSE 18X1.5 (NEEDLE) IMPLANT
NEEDLE HYPO 18GX1.5 SHARP (NEEDLE)
NS IRRIG 1000ML POUR BTL (IV SOLUTION) ×2 IMPLANT
PACK TOTAL KNEE CUSTOM (KITS) ×2 IMPLANT
PENCIL SMOKE EVACUATOR (MISCELLANEOUS) IMPLANT
PIN DRILL FIX HALF THREAD (BIT) ×2 IMPLANT
PIN FIX SIGMA LCS THRD HI (PIN) ×2 IMPLANT
PROTECTOR NERVE ULNAR (MISCELLANEOUS) ×2 IMPLANT
SET HNDPC FAN SPRY TIP SCT (DISPOSABLE) ×1 IMPLANT
SET PAD KNEE POSITIONER (MISCELLANEOUS) ×2 IMPLANT
SUT MNCRL AB 4-0 PS2 18 (SUTURE) ×2 IMPLANT
SUT STRATAFIX PDS+ 0 24IN (SUTURE) ×2 IMPLANT
SUT VIC AB 1 CT1 36 (SUTURE) ×2 IMPLANT
SUT VIC AB 2-0 CT1 27 (SUTURE) ×6
SUT VIC AB 2-0 CT1 TAPERPNT 27 (SUTURE) ×3 IMPLANT
SYR 3ML LL SCALE MARK (SYRINGE) ×2 IMPLANT
TIBIA ATTUNE KNEE SYS BASE SZ6 (Knees) ×2 IMPLANT
TRAY FOLEY MTR SLVR 16FR STAT (SET/KITS/TRAYS/PACK) ×2 IMPLANT
WATER STERILE IRR 1000ML POUR (IV SOLUTION) ×4 IMPLANT
WRAP KNEE MAXI GEL POST OP (GAUZE/BANDAGES/DRESSINGS) ×2 IMPLANT

## 2019-11-25 NOTE — Interval H&P Note (Signed)
History and Physical Interval Note:  11/25/2019 8:43 AM  Brent Ho  has presented today for surgery, with the diagnosis of Right knee osteoarthritis.  The various methods of treatment have been discussed with the patient and family. After consideration of risks, benefits and other options for treatment, the patient has consented to  Procedure(s) with comments: TOTAL KNEE ARTHROPLASTY (Right) - 70 mins as a surgical intervention.  The patient's history has been reviewed, patient examined, no change in status, stable for surgery.  I have reviewed the patient's chart and labs.  Questions were answered to the patient's satisfaction.     [name]

## 2019-11-25 NOTE — Anesthesia Postprocedure Evaluation (Signed)
Anesthesia Post Note  Patient: Brent Ho  Procedure(s) Performed: TOTAL KNEE ARTHROPLASTY (Right Knee)     Patient location during evaluation: PACU Anesthesia Type: Regional, MAC and Spinal Level of consciousness: oriented and awake and alert Pain management: pain level controlled Vital Signs Assessment: post-procedure vital signs reviewed and stable Respiratory status: spontaneous breathing, respiratory function stable and patient connected to nasal cannula oxygen Cardiovascular status: blood pressure returned to baseline and stable Postop Assessment: no headache, no backache, no apparent nausea or vomiting and spinal receding Anesthetic complications: no   No complications documented.  Last Vitals:  Vitals:   11/25/19 1430 11/25/19 1445  BP: 133/81 139/81  Pulse: 78 77  Resp: 12 12  Temp:    SpO2: 94% 94%    Last Pain:  Vitals:   11/25/19 1445  TempSrc:   PainSc: 0-No pain    LLE Motor Response: Purposeful movement (11/25/19 1445) LLE Sensation: Decreased (11/25/19 1445) RLE Motor Response: Purposeful movement (11/25/19 1445) RLE Sensation: Decreased (11/25/19 1445) L Sensory Level: L2-Upper inner thigh, upper buttock (11/25/19 1445) R Sensory Level: L2-Upper inner thigh, upper buttock (11/25/19 1445)  Pervis Hocking

## 2019-11-25 NOTE — Transfer of Care (Signed)
Immediate Anesthesia Transfer of Care Note  Patient: Helyn Numbers  Procedure(s) Performed: TOTAL KNEE ARTHROPLASTY (Right Knee)  Patient Location: PACU  Anesthesia Type:Spinal  Level of Consciousness: awake, alert  and oriented  Airway & Oxygen Therapy: Patient Spontanous Breathing and Patient connected to face mask oxygen  Post-op Assessment: Report given to RN and Post -op Vital signs reviewed and stable  Post vital signs: Reviewed and stable  Last Vitals:  Vitals Value Taken Time  BP    Temp    Pulse 82 11/25/19 1312  Resp 15 11/25/19 1312  SpO2 96 % 11/25/19 1312  Vitals shown include unvalidated device data.  Last Pain:  Vitals:   11/25/19 0813  TempSrc: Oral      Patients Stated Pain Goal: 4 (11/25/19 0816)  Complications: No complications documented.

## 2019-11-25 NOTE — Anesthesia Preprocedure Evaluation (Addendum)
Anesthesia Evaluation  Patient identified by MRN, date of birth, ID band Patient awake    Reviewed: Allergy & Precautions, NPO status , Patient's Chart, lab work & pertinent test results  Airway Mallampati: II  TM Distance: >3 FB Neck ROM: Full    Dental  (+) Dental Advisory Given, Missing, Poor Dentition,    Pulmonary former smoker,    Pulmonary exam normal breath sounds clear to auscultation       Cardiovascular hypertension, Pt. on medications Normal cardiovascular exam Rhythm:Regular Rate:Normal     Neuro/Psych PSYCHIATRIC DISORDERS Anxiety Depression negative neurological ROS     GI/Hepatic negative GI ROS, Neg liver ROS,   Endo/Other  Obesity BMI 32  Renal/GU negative Renal ROS  negative genitourinary   Musculoskeletal  (+) Arthritis , Osteoarthritis,  Right knee OA   Abdominal (+) + obese,   Peds  Hematology negative hematology ROS (+) hct 43.8, plt 175   Anesthesia Other Findings   Reproductive/Obstetrics negative OB ROS                            Anesthesia Physical Anesthesia Plan  ASA: II  Anesthesia Plan: MAC, Regional and Spinal   Post-op Pain Management:  Regional for Post-op pain   Induction: Intravenous  PONV Risk Score and Plan: 2 and Treatment may vary due to age or medical condition, Propofol infusion and TIVA  Airway Management Planned: Natural Airway and Simple Face Mask  Additional Equipment: None  Intra-op Plan:   Post-operative Plan:   Informed Consent: I have reviewed the patients History and Physical, chart, labs and discussed the procedure including the risks, benefits and alternatives for the proposed anesthesia with the patient or authorized representative who has indicated his/her understanding and acceptance.       Plan Discussed with: CRNA  Anesthesia Plan Comments:        Anesthesia Quick Evaluation

## 2019-11-25 NOTE — Anesthesia Procedure Notes (Signed)
Anesthesia Regional Block: Adductor canal block   Pre-Anesthetic Checklist: ,, timeout performed, Correct Patient, Correct Site, Correct Laterality, Correct Procedure, Correct Position, site marked, Risks and benefits discussed,  Surgical consent,  Pre-op evaluation,  At surgeon's request and post-op pain management  Laterality: Right  Prep: Maximum Sterile Barrier Precautions used, chloraprep       Needles:  Injection technique: Single-shot  Needle Type: Echogenic Stimulator Needle     Needle Length: 9cm  Needle Gauge: 22     Additional Needles:   Procedures:,,,, ultrasound used (permanent image in chart),,,,  Narrative:  Start time: 11/25/2019 9:35 AM End time: 11/25/2019 9:40 AM Injection made incrementally with aspirations every 5 mL.  Performed by: Personally  Anesthesiologist: Lannie Fields, DO  Additional Notes: Monitors applied. No increased pain on injection. No increased resistance to injection. Injection made in 5cc increments. Good needle visualization. Patient tolerated procedure well.

## 2019-11-25 NOTE — Anesthesia Procedure Notes (Signed)
Spinal  Patient location during procedure: OR End time: 11/25/2019 11:36 AM Staffing Performed: resident/CRNA  Anesthesiologist: Pervis Hocking, DO Resident/CRNA: Maxwell Caul, CRNA Preanesthetic Checklist Completed: patient identified, IV checked, site marked, risks and benefits discussed, surgical consent, monitors and equipment checked, pre-op evaluation and timeout performed Spinal Block Patient position: sitting Prep: DuraPrep Patient monitoring: heart rate, cardiac monitor, continuous pulse ox and blood pressure Approach: midline Location: L3-4 Injection technique: single-shot Needle Needle type: Sprotte and Pencan  Needle gauge: 24 G Needle length: 10 cm Assessment Sensory level: T4 Additional Notes IV functioning, monitors applied to pt. Expiration date of kit checked and confirmed to be in date. Sterile prep and drape, hand hygiene and sterile gloved used. Pt was positioned and spine was prepped in sterile fashion. Skin was anesthetized with lidocaine. Free flow of clear CSF obtained prior to injecting local anesthetic into CSF x 1 attempt. Spinal needle aspirated freely following injection. Needle was carefully withdrawn, and pt tolerated procedure well. Loss of motor and sensory on exam post injection. Dr Doroteo Glassman at bedside during entire placement.

## 2019-11-25 NOTE — Discharge Instructions (Signed)

## 2019-11-25 NOTE — Anesthesia Procedure Notes (Signed)
Procedure Name: MAC Date/Time: 11/25/2019 11:30 AM Performed by: Maxwell Caul, CRNA Pre-anesthesia Checklist: Patient identified, Emergency Drugs available, Suction available and Patient being monitored Oxygen Delivery Method: Simple face mask

## 2019-11-25 NOTE — Progress Notes (Signed)
Assisted Dr. Lacy Duverney with Right Knee Adductor Canal Block. Side rails up, monitors on throughout procedure. See vital signs in flow sheet. Tolerated Procedure well.

## 2019-11-25 NOTE — Progress Notes (Signed)
RN reviewed discharge instructions with patient and family. All questions answered.   Paperwork given. Prescriptions electronically sent to patient pharmacy.    NT rolled patient down with all belongings to family car.     Johnluke Haugen, RN  

## 2019-11-25 NOTE — Op Note (Signed)
NAME:  [facility]                      MEDICAL RECORD NO.:  [id number]                             FACILITY:  [facility]      PHYSICIAN:  Madlyn Frankel. Charlann Boxer, M.D.  DATE OF BIRTH:  04/26/1945      DATE OF PROCEDURE:  11/25/2019                                     OPERATIVE REPORT         PREOPERATIVE DIAGNOSIS:  Right knee osteoarthritis.      POSTOPERATIVE DIAGNOSIS:  Right knee osteoarthritis.      FINDINGS:  The patient was noted to have complete loss of cartilage and   bone-on-bone arthritis with associated osteophytes in the medial and patellofemoral compartments of   the knee with a significant synovitis and associated effusion.  The patient had failed months of conservative treatment including medications, injection therapy, activity modification.     PROCEDURE:  Right total knee replacement.      COMPONENTS USED:  DePuy Attune rotating platform posterior stabilized knee   system, a size 6 femur, 6 tibia, size 7 mm PS AOX insert, and 38 anatomic patellar   button.      SURGEON:  Madlyn Frankel. Charlann Boxer, M.D.      ASSISTANT:  Dennie Bible, PA-C.      ANESTHESIA:  Regional and Spinal.      SPECIMENS:  None.      COMPLICATION:  None.      DRAINS:  None.  EBL: <100 cc      TOURNIQUET TIME:   Total Tourniquet Time Documented: Thigh (Right) - 29 minutes Total: Thigh (Right) - 29 minutes  .      The patient was stable to the recovery room.      INDICATION FOR PROCEDURE:  Brent Ho is a 74 y.o. male patient of   mine.  The patient had been seen, evaluated, and treated for months conservatively in the   office with medication, activity modification, and injections.  The patient had   radiographic changes of bone-on-bone arthritis with endplate sclerosis and osteophytes noted.  Based on the radiographic changes and failed conservative measures, the patient   decided to proceed with definitive treatment, total knee replacement.  Risks of infection, DVT, component failure, need for  revision surgery, neurovascular injury were reviewed in the office setting.  The postop course was reviewed stressing the efforts to maximize post-operative satisfaction and function.  Consent was obtained for benefit of pain   relief.      PROCEDURE IN DETAIL:  The patient was brought to the operative theater.   Once adequate anesthesia, preoperative antibiotics, 2 gm of Ancef,1 gm of Tranexamic Acid, and 10 mg of Decadron administered, the patient was positioned supine with a right thigh tourniquet placed.  The  right lower extremity was prepped and draped in sterile fashion.  A time-   out was performed identifying the patient, planned procedure, and the appropriate extremity.      The right lower extremity was placed in the [facility] leg holder.  The leg was   exsanguinated, tourniquet elevated to 250 mmHg.  A midline incision was   made followed  by median parapatellar arthrotomy.  Following initial   exposure, attention was first directed to the patella.  Precut   measurement was noted to be 26 mm.  I resected down to 15 mm and used a   38 anatomic patellar button to restore patellar height as well as cover the cut surface.      The lug holes were drilled and a metal shim was placed to protect the   patella from retractors and saw blade during the procedure.      At this point, attention was now directed to the femur.  The femoral   canal was opened with a drill, irrigated to try to prevent fat emboli.  An   intramedullary rod was passed at 5 degrees valgus, 9 mm of bone was   resected off the distal femur.  Following this resection, the tibia was   subluxated anteriorly.  Using the extramedullary guide, 2 mm of bone was resected off   the proximal medial tibia.  We confirmed the gap would be   stable medially and laterally with a size 5 spacer block as well as confirmed that the tibial cut was perpendicular in the coronal plane, checking with an alignment rod.      Once this was done, I  sized the femur to be a size 6 in the anterior-   posterior dimension, chose a standard component based on medial and   lateral dimension.  The size 6 rotation block was then pinned in   position anterior referenced using the C-clamp to set rotation.  The   anterior, posterior, and  chamfer cuts were made without difficulty nor   notching making certain that I was along the anterior cortex to help   with flexion gap stability.      The final box cut was made off the lateral aspect of distal femur.      At this point, the tibia was sized to be a size 6.  The size 6 tray was   then pinned in position through the medial third of the tubercle,   drilled, and keel punched.  Trial reduction was now carried with a 6 femur,  6 tibia, a size 7 mm PS insert, and the 38 anatomic patella botton.  The knee was brought to full extension with good flexion stability with the patella   tracking through the trochlea without application of pressure.  Given   all these findings the trial components removed.  Final components were   opened and cement was mixed.  The knee was irrigated with normal saline solution and pulse lavage.  The synovial lining was   then injected with 30 cc of 0.25% Marcaine with epinephrine, 1 cc of Toradol and 30 cc of NS for a total of 61 cc.     Final implants were then cemented onto cleaned and dried cut surfaces of bone with the knee brought to extension with a size 7 mm PS trial insert.      Once the cement had fully cured, excess cement was removed   throughout the knee.  I confirmed that I was satisfied with the range of   motion and stability, and the final size 7 mm PS AOX insert was chosen.  It was   placed into the knee.      The tourniquet had been let down at 29 minutes.  No significant   hemostasis was required.  The extensor mechanism was then reapproximated using #1 Vicryl and #1  Stratafix sutures with the knee   in flexion.  The   remaining wound was closed with 2-0  Vicryl and running 4-0 Monocryl.   The knee was cleaned, dried, dressed sterilely using Dermabond and   Aquacel dressing.  The patient was then   brought to recovery room in stable condition, tolerating the procedure   well.   Please note that Physician Assistant, Dennie Bible, PA-C was present for the entirety of the case, and was utilized for pre-operative positioning, peri-operative retractor management, general facilitation of the procedure and for primary wound closure at the end of the case.              Madlyn Frankel Charlann Boxer, M.D.    11/25/2019 12:48 PM

## 2019-11-25 NOTE — Evaluation (Signed)
Physical Therapy Evaluation Patient Details Name: Brent Ho MRN: [id number] DOB: 1945-06-08 Today's Date: 11/25/2019   History of Present Illness  Patient is 74 y.o. male s/p Rt TKA on 11/25/19 with PMH significant for HTN, OA, anxiety.    Clinical Impression  Brent Ho is a 74 y.o. male POD 0 s/p Rt TKA. Patient reports independene with mobility at baseline. Patient is now limited by functional impairments (see PT problem list below) and requires min guard/supervision for transfers and gait with RW. Patient was able to ambulate ~120 feet with RW and min guard/supervision and cues for safe walker management. Patient educated on safe sequencing for stair mobility and verbalized safe guarding position for people assisting with mobility. Patient's wife present for session and instructed on safe guarding for gait and stair mobility. Educated on use of knee immobilizer for safety with stair mobility to enter home and that pt does not need to wear the brace completing stairs. Patient instructed in exercises to facilitate ROM and circulation. Patient will benefit from continued skilled PT interventions to address impairments and progress towards PLOF. Patient has met mobility goals at adequate level for discharge home; will continue to follow if pt continues acute stay to progress towards Mod I goals.     Follow Up Recommendations Follow surgeon's recommendation for DC plan and follow-up therapies;Outpatient PT    Equipment Recommendations  3in1 (PT)    Recommendations for Other Services       Precautions / Restrictions Precautions Precautions: Fall Restrictions Weight Bearing Restrictions: No Other Position/Activity Restrictions: WBAT      Mobility  Bed Mobility Overal bed mobility: Needs Assistance Bed Mobility: Supine to Sit;Sit to Supine     Supine to sit: Supervision Sit to supine: Supervision   General bed mobility comments: no assist required, pt taking increased  time.  Transfers Overall transfer level: Needs assistance   Transfers: Sit to/from Stand Sit to Stand: Min guard;Supervision         General transfer comment: cues for safe technique with RW, no assist required for power up and pt steady with rising. cues for safe reach back to sit.  Ambulation/Gait Ambulation/Gait assistance: Min guard;Supervision Gait Distance (Feet): 120 Feet Assistive device: Rolling walker (2 wheeled) Gait Pattern/deviations: Step-to pattern;Decreased stride length;Decreased weight shift to right Gait velocity: decr   General Gait Details: cues for safe step pattern and proximity to RW, no overt LOB noted. pt maintained safe position throughout. pt's wife educated on safe guarding position and demonstrated proper guarding.  Stairs Stairs: Yes Stairs assistance: Min guard;Supervision Stair Management: One rail Right;Step to pattern;Forwards;With crutches Number of Stairs: 6 (2x3) General stair comments: cues for step sequencing with crutch and "up wtih good, down with bad". no overt LOB noted and pt performed with cues from therapist and then 2nd bout with cues and assist from wife.   Wheelchair Mobility    Modified Rankin (Stroke Patients Only)       Balance Overall balance assessment: Needs assistance Sitting-balance support: Feet supported Sitting balance-Leahy Scale: Good     Standing balance support: During functional activity;Bilateral upper extremity supported Standing balance-Leahy Scale: Fair            Pertinent Vitals/Pain Pain Assessment: No/denies pain    Home Living Family/patient expects to be discharged to:: Private residence Living Arrangements: Spouse/significant other Available Help at Discharge: Family Type of Home: House Home Access: Stairs to enter Entrance Stairs-Rails: Chemical engineer of Steps: 5 Home Layout: One level Home Equipment: Environmental consultant -  2 wheels;Crutches;Shower seat;Grab bars -  tub/shower      Prior Function Level of Independence: Independent               Hand Dominance   Dominant Hand: Right    Extremity/Trunk Assessment   Upper Extremity Assessment Upper Extremity Assessment: Overall WFL for tasks assessed    Lower Extremity Assessment Lower Extremity Assessment: RLE deficits/detail RLE Deficits / Details: good quad acitvation, no extensor lag with SLR RLE Sensation: WNL RLE Coordination: WNL    Cervical / Trunk Assessment Cervical / Trunk Assessment: Normal  Communication   Communication: No difficulties  Cognition Arousal/Alertness: Awake/alert Behavior During Therapy: WFL for tasks assessed/performed Overall Cognitive Status: Within Functional Limits for tasks assessed             General Comments General comments (skin integrity, edema, etc.): pt with mild buckling initially on standing but able to reduce weight bearing on Rt LE to prevent. Knee immobilizer provided for safety with 5 stairs to enter home, educated that pt does not need to wear it any longer after getting up stairs into home.    Exercises Total Joint Exercises Ankle Circles/Pumps: AROM;Both;10 reps;Supine Quad Sets: AROM;Right;5 reps;Supine Short Arc Quad: AROM;Right;Other reps (comment);Supine (3) Heel Slides: AROM;Right;Other reps (comment);Supine (3) Hip ABduction/ADduction: AROM;Right;Other reps (comment);Supine (2) Straight Leg Raises: AROM;Right;Other reps (comment);Supine (2)   Assessment/Plan    PT Assessment Patient needs continued PT services  PT Problem List Decreased strength;Decreased range of motion;Decreased activity tolerance;Decreased balance;Decreased mobility;Decreased knowledge of use of DME       PT Treatment Interventions DME instruction;Gait training;Stair training;Functional mobility training;Therapeutic activities;Therapeutic exercise;Balance training;Patient/family education    PT Goals (Current goals can be found in the Care Plan  section)  Acute Rehab PT Goals Patient Stated Goal: get home and back to independence PT Goal Formulation: With patient Time For Goal Achievement: 12/02/19 Potential to Achieve Goals: Good    Frequency 7X/week    AM-PAC PT "6 Clicks" Mobility  Outcome Measure Help needed turning from your back to your side while in a flat bed without using bedrails?: None Help needed moving from lying on your back to sitting on the side of a flat bed without using bedrails?: None Help needed moving to and from a bed to a chair (including a wheelchair)?: A Little Help needed standing up from a chair using your arms (e.g., wheelchair or bedside chair)?: A Little Help needed to walk in hospital room?: A Little Help needed climbing 3-5 steps with a railing? : A Little 6 Click Score: 20    End of Session Equipment Utilized During Treatment: Gait belt Activity Tolerance: Patient tolerated treatment well Patient left: in bed;with call bell/phone within reach;with family/visitor present;with nursing/sitter in room Nurse Communication: Mobility status PT Visit Diagnosis: Muscle weakness (generalized) (M62.81);Difficulty in walking, not elsewhere classified (R26.2)    Time: 6967-8938 PT Time Calculation (min) (ACUTE ONLY): 48 min   Charges:   PT Evaluation $PT Eval Low Complexity: 1 Low PT Treatments $Gait Training: 8-22 mins $Therapeutic Exercise: 8-22 mins      Verner Mould, DPT Acute Rehabilitation Services  Office [phone] Pager [phone]  11/25/2019 6:34 PM

## 2019-11-26 ENCOUNTER — Encounter (HOSPITAL_COMMUNITY): Payer: Self-pay | Admitting: Orthopedic Surgery

## 2019-11-27 ENCOUNTER — Telehealth: Payer: Self-pay | Admitting: *Deleted

## 2019-11-27 ENCOUNTER — Encounter: Payer: Self-pay | Admitting: Physical Therapy

## 2019-11-27 ENCOUNTER — Ambulatory Visit: Payer: Medicare Other | Attending: Orthopedic Surgery | Admitting: Physical Therapy

## 2019-11-27 ENCOUNTER — Other Ambulatory Visit: Payer: Self-pay

## 2019-11-27 DIAGNOSIS — R2689 Other abnormalities of gait and mobility: Secondary | ICD-10-CM | POA: Diagnosis present

## 2019-11-27 DIAGNOSIS — R262 Difficulty in walking, not elsewhere classified: Secondary | ICD-10-CM

## 2019-11-27 DIAGNOSIS — M25561 Pain in right knee: Secondary | ICD-10-CM | POA: Diagnosis not present

## 2019-11-27 DIAGNOSIS — M6281 Muscle weakness (generalized): Secondary | ICD-10-CM | POA: Diagnosis present

## 2019-11-27 DIAGNOSIS — M25661 Stiffness of right knee, not elsewhere classified: Secondary | ICD-10-CM

## 2019-11-27 NOTE — Telephone Encounter (Signed)
Patient called and left message to return call to [employer] at [phone]. Patient will need to be informed of potential exposure to employee that later tested positive COVID-19 and offered testing. If patient returns call please transfer to [facility].

## 2019-11-27 NOTE — Therapy (Signed)
[facility] [address]  [address] [place], [place], [address] Phone: [phone]   Fax:  [phone]  Physical Therapy Evaluation  Patient Details  Name: Brent Ho MRN: [id number] Date of Birth: [birth year] Referring Provider (PT): Lanney Gins, PA-C Durene Romans, MD - surgeon)   Encounter Date: 11/27/2019   PT End of Session - 11/27/19 1700    Visit Number 1    Number of Visits 16    Date for PT Re-Evaluation 01/22/20    Authorization Type [employer] Medicare    PT Start Time 1700    PT Stop Time 1800    PT Time Calculation (min) 60 min    Activity Tolerance Patient tolerated treatment well;Patient limited by pain    Behavior During Therapy [facility] for tasks assessed/performed           Past Medical History:  Diagnosis Date  . Anxiety   . Arthritis   . Hypercholesteremia   . Hypertension   . Pneumonia    hx of     Past Surgical History:  Procedure Laterality Date  . BUNIONECTOMY    . 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    Subjective Assessment - 11/27/19 1703    Subjective 2 days s/p R TKA with discharge from hospital yesterday. Started HEP at home yesterday and states all of his muscle are killing him today. States he has been trying to stay ahead of the pain with pain meds but is only taking one rather than two because he feels that it would make him too "loopy" - took a pain pill at ~4:00 before coming to PT today.  He is ambulatory in the home using a RW but states he uses crutches to navigate the steps in/out of house. States he has been sleeping partially on his side with knee slightly bent as he cannot get comfortable with the knee straight if laying on his back.    Pertinent History R TKA  - 11/25/19    Limitations  Sitting;Standing;Walking    Patient Stated Goals "to be able to go back to work in 6 weeks"    Currently in Pain? Yes    Pain Score 5    4-5/10   Pain Location Knee    Pain Orientation Right    Pain Descriptors / Indicators Throbbing    Pain Type Acute pain;Surgical pain    Pain Radiating Towards n/a    Pain Onset In the past 7 days    Pain Frequency Constant    Aggravating Factors  lifting the leg to straighten it out or bend it    Pain Relieving Factors pain meds, ice    Effect of Pain on Daily Activities limits exercise tolerance, difficulty finding comfortable position              [facility] PT Assessment - 11/27/19 1700      Assessment   Medical Diagnosis R TKA    Referring Provider (PT) Lanney Gins, PA-C   Durene Romans, MD - surgeon   Onset Date/Surgical Date 11/25/19    Next MD Visit ~2 weeks    Prior Therapy none       Precautions   Precautions None      Restrictions   Weight Bearing Restrictions No  Balance Screen   Has the patient fallen in the past 6 months No    Has the patient had a decrease in activity level because of a fear of falling?  No    Is the patient reluctant to leave their home because of a fear of falling?  No      Home Environment   Living Environment Private residence    Living Arrangements Alone    Type of Home House    Home Access Stairs to enter    Entrance Stairs-Number of Steps 4    Home Layout One level    Home Equipment Walker - 2 wheels;Crutches;Bedside commode;Shower seat      Prior Function   Level of Independence Independent    Vocation Part time employment   24 hrs/wk   Runner, broadcasting/film/video - office and warehouse    Leisure fishing, "being active" but no regular exercise      Cognition   Overall Cognitive Status Within Functional Limits for tasks assessed      Observation/Other Assessments   Focus on Therapeutic Outcomes (FOTO)  Knee - 36% (64% limitation); Predicted 72% (28%  limitation)      ROM / Strength   AROM / PROM / Strength AROM;PROM;Strength      AROM   AROM Assessment Site Knee    Right/Left Knee Right;Left    Right Knee Extension 12    Right Knee Flexion 64    Left Knee Extension 0    Left Knee Flexion 136      PROM   PROM Assessment Site Knee    Right/Left Knee Right    Right Knee Extension 10    Right Knee Flexion 79      Strength   Overall Strength Comments tested in sitting    Strength Assessment Site Hip;Knee;Ankle    Right/Left Hip Right;Left    Right Hip Flexion 2+/5    Right Hip Extension 2+/5    Right Hip ABduction 3-/5    Right Hip ADduction 3-/5    Left Hip Flexion 4+/5    Left Hip Extension 4/5    Left Hip ABduction 4/5    Left Hip ADduction 4/5    Right/Left Knee Right;Left    Left Knee Flexion 5/5    Left Knee Extension 5/5    Right/Left Ankle Right;Left    Right Ankle Dorsiflexion 4/5    Left Ankle Dorsiflexion 4+/5      Flexibility   Soft Tissue Assessment /Muscle Length yes    Hamstrings mod to severe tight B      Transfers   Transfers Sit to Stand;Stand to Sit    Sit to Stand 5: Supervision;With upper extremity assist    Sit to Stand Details Verbal cues for technique;Verbal cues for precautions/safety    Sit to Stand Details (indicate cue type and reason) cues to push from seat rather than pulling on walker    Stand to Sit 5: Supervision;With upper extremity assist      Ambulation/Gait   Ambulation/Gait Yes    Ambulation/Gait Assistance 5: Supervision    Ambulation/Gait Assistance Details cues for step through pattern to promote increased knee extension during gait    Ambulation Distance (Feet) 60 Feet    Assistive device Rolling walker    Gait Pattern Step-to pattern;Decreased stride length;Right flexed knee in stance;Decreased weight shift to right;Decreased stance time - right;Decreased hip/knee flexion - right;Antalgic    Ambulation Surface Level;Indoor    Gait  velocity decreased                       Objective measurements completed on examination: See above findings.       [facility] Adult PT Treatment/Exercise - 11/27/19 1700      Self-Care   Self-Care Heat/Ice Application;Posture    Posture Discussed positioning in recliner encouraging use of pillow/towel roll under distal calf when leg supported on leg rest to promote knee extension.    Heat/Ice Application Recommended use of ice after completion of HEP as well as PRN for pain/edema, keeping ice on for no >20 minutes at a time with barrier between ice pack and skin.      Exercises   Exercises Knee/Hip      Knee/Hip Exercises: Stretches   Passive Hamstring Stretch Right;30 seconds;1 rep    Passive Hamstring Stretch Limitations seated hip hinge    Gastroc Stretch Right;30 seconds;2 reps    Gastroc Stretch Limitations standing at wall (difficulty keeping heel on floor) & seated with strap/belt      Modalities   Modalities Vasopneumatic      Vasopneumatic   Number Minutes Vasopneumatic  10 minutes    Vasopnuematic Location  Knee    Vasopneumatic Pressure Low    Vasopneumatic Temperature  34                  PT Education - 11/27/19 1750    Education Details PT eval findings, anticipated POC, brief review of hospital D/C HEP + addition of HS and gastroc stretches to promote knee extension ROM    Person(s) Educated Patient    Methods Explanation;Verbal cues;Tactile cues;Demonstration;Handout    Comprehension Verbalized understanding;Verbal cues required;Tactile cues required;Returned demonstration;Need further instruction            PT Short Term Goals - 11/27/19 1800      PT SHORT TERM GOAL #1   Title Patient will be independent with initial HEP    Status New    Target Date 12/11/19      PT SHORT TERM GOAL #2   Title Patient will demonstrate R knee AROM >/= 5-90 dg to increase ease of mobility    Status New    Target Date 12/18/19      PT SHORT TERM GOAL #3   Title Patient will  ambulate with good gait pattern with SPC or LRAD    Status New    Target Date 12/18/19             PT Long Term Goals - 11/27/19 1800      PT LONG TERM GOAL #1   Title Patient will be independent with ongoing/advanced HEP    Status New    Target Date 01/22/20      PT LONG TERM GOAL #2   Title Patient will demonstrate R knee AROM >/= 2-120 dg to allow for normal gait and stair mechanics    Status New    Target Date 01/22/20      PT LONG TERM GOAL #3   Title Patient will demonstrate improved R LE strength to >/= 4+/5 for improved stability and ease of mobility    Status New    Target Date 01/22/20      PT LONG TERM GOAL #4   Title Patient will ambulate with normal gait pattern w/o AD    Status New    Target Date 01/22/20      PT LONG TERM GOAL #5  Title Patient will negotiate stairs reciprocally with normal step pattern w/o limitation due to R knee pain, LOM or weakness    Status New    Target Date 01/22/20                  Plan - 11/27/19 1759    Clinical Impression Statement Brent Ho is a 74 y/o male who presents to OP PT 2 days s/p R TKA on 11/25/19. He was discharged home from the hospital yesterday and arrives to PT via Staxi-chair but was able to ambulate from the waiting room to the treatment room with the RW demonstrating antalgic step-to gait pattern with decreased weight shift to and decreased stance time on R LE with R knee flexed in stance as well as decreased hip and knee flexion during swing phase of gait. He reports increased pain today resulting from attempts to initiate HEP yesterday. Deficits include R knee pain, moderate warmth and edema in R knee with ~1x2" patch of blood visible through post-op bandage over distal incision, severely limited R knee AROM (12-64 dg) and PROM (10-79 dg), moderate to severe R LE weakness with inability to raise leg in SLR or actively extend knee in LAQ, and limited gait tolerance with antalgic gait pattern and dependence on  AD. Brent Ho will benefit from skilled PT intervention to address the above listed deficits, reduce pain, and restore functional ROM and strength to allow for improved knee stability for improved balance and gait to maximize function and safety with mobility within home and community.    Personal Factors and Comorbidities Comorbidity 3+;Transportation    Comorbidities h/o R foot bunionectomy, HTN, h/o acute respiratory failure d/t COVID-19, sepsis, anxiety, depression, HOH with hearing aids    Examination-Activity Limitations Bathing;Bed Mobility;Bend;Caring for Others;Carry;Dressing;Hygiene/Grooming;Locomotion Level;Sit;Sleep;Squat;Stairs;Stand;Toileting;Transfers    Examination-Participation Restrictions Community Activity;Driving;Interpersonal Relationship;Occupation;Shop;Yard Work    Conservation officer, historic buildings Stable/Uncomplicated    Clinical Decision Making Low    Rehab Potential Excellent    PT Frequency 2x / week   Pt only able to come 2x/wk due to wife's work schedule and wife providing transportation   PT Duration 8 weeks    PT Treatment/Interventions ADLs/Self Care Home Management;Cryotherapy;Electrical Stimulation;Iontophoresis /ml Dexamethasone;Moist Heat;DME Instruction;Gait training;Stair training;Functional mobility training;Therapeutic activities;Therapeutic exercise;Balance training;Neuromuscular re-education;Patient/family education;Manual techniques;Scar mobilization;Passive range of motion;Dry needling;Taping;Vasopneumatic Device;Joint Manipulations    PT Next Visit Plan Review and modifiy HEP as needed; R knee ROM; LE strengthening; gait training with RW; manual therapy and modalities PRN for pain, ROM and edema management    PT Home Exercise Plan 9/2 - seated HS & gastroc stretches added to hospital D/C HEP    Consulted and Agree with Plan of Care Patient;Family member/caregiver    Family Member Consulted wife - Kendal Hymen           Patient will benefit from skilled  therapeutic intervention in order to improve the following deficits and impairments:  Abnormal gait, Decreased activity tolerance, Decreased balance, Decreased knowledge of precautions, Decreased knowledge of use of DME, Decreased mobility, Decreased range of motion, Decreased safety awareness, Decreased scar mobility, Decreased strength, Difficulty walking, Increased edema, Increased fascial restricitons, Increased muscle spasms, Impaired perceived functional ability, Impaired flexibility, Impaired sensation, Pain  Visit Diagnosis: Acute pain of right knee - Plan: PT plan of care cert/re-cert  Stiffness of right knee, not elsewhere classified - Plan: PT plan of care cert/re-cert  Muscle weakness (generalized) - Plan: PT plan of care cert/re-cert  Other abnormalities of gait and mobility - Plan: PT plan of  care cert/re-cert  Difficulty in walking, not elsewhere classified - Plan: PT plan of care cert/re-cert     Problem List Patient Active Problem List   Diagnosis Date Noted  . 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 Sepsis (HCC) 07/12/2018  . COVID-19 virus infection 07/12/2018  . Acute respiratory failure with hypoxia (HCC) 07/12/2018  . Depression 07/12/2018  . Hypercholesteremia   . Hypertension   . Acute respiratory disease due to COVID-19 virus     Marry GuanJoAnne M Nozomi Mettler, PT, MPT 11/27/2019, 7:11 PM  [facility] [place] [address]  [address] [place], [place], [address] Phone: [phone]   Fax:  [phone]  Name: Brent Ho MRN: [id number] Date of Birth: 1946-02-22

## 2019-11-27 NOTE — Patient Instructions (Signed)
    Home exercise program created by Marijo Quizon, PT.  For questions, please contact Taffany Heiser via phone at [phone] or email at [email or web address]  [facility] [place] [address]  [address] [place], [place], [address] Phone: [phone]   Fax:  [phone]    

## 2019-11-28 NOTE — Telephone Encounter (Signed)
Brent Ho the pt is return you call

## 2019-11-28 NOTE — Telephone Encounter (Signed)
Patient called, no answer. Left message to return call to [facility] at [phone].

## 2019-11-28 NOTE — Telephone Encounter (Signed)
Patient informed of potential exposure to employee who later tested positive for COVID-19. Patient verbalized understanding. Patient declines testing at this time. Patient advised to return call to [phone] if he changes his mind about testing. Patient verbalized understanding.

## 2019-12-03 ENCOUNTER — Ambulatory Visit: Payer: Medicare Other

## 2019-12-04 ENCOUNTER — Ambulatory Visit: Payer: Medicare Other

## 2019-12-09 ENCOUNTER — Other Ambulatory Visit: Payer: Self-pay

## 2019-12-09 ENCOUNTER — Encounter: Payer: Self-pay | Admitting: Physical Therapy

## 2019-12-09 ENCOUNTER — Ambulatory Visit: Payer: Medicare Other | Admitting: Physical Therapy

## 2019-12-09 DIAGNOSIS — M25661 Stiffness of right knee, not elsewhere classified: Secondary | ICD-10-CM

## 2019-12-09 DIAGNOSIS — M6281 Muscle weakness (generalized): Secondary | ICD-10-CM

## 2019-12-09 DIAGNOSIS — M25561 Pain in right knee: Secondary | ICD-10-CM

## 2019-12-09 DIAGNOSIS — R2689 Other abnormalities of gait and mobility: Secondary | ICD-10-CM

## 2019-12-09 DIAGNOSIS — R262 Difficulty in walking, not elsewhere classified: Secondary | ICD-10-CM

## 2019-12-09 NOTE — Patient Instructions (Signed)
    Home exercise program created by Eliyana Pagliaro, PT.  For questions, please contact Adaliz Dobis via phone at [phone] or email at [email or web address]  [facility] [place] [address]  [address] [place], [place], [address] Phone: [phone]   Fax:  [phone]    

## 2019-12-09 NOTE — Therapy (Signed)
[facility] [address]  [place], [place], [address] Phone: [phone]   Fax:  [phone]  Physical Therapy Treatment / Progress Note  Patient Details  Name: Brent Ho MRN: [id number] Date of Birth: Nov 24, 1945 Referring Provider (PT): Danae Orleans, PA-C Paralee Cancel, MD - surgeon)   Encounter Date: 12/09/2019   PT End of Session - 12/09/19 1539    Visit Number 2    Number of Visits 16    Date for PT Re-Evaluation 01/22/20    Authorization Type U[employer] Medicare    PT Start Time 1539    PT Stop Time 1637    PT Time Calculation (min) 58 min    Activity Tolerance Patient tolerated treatment well;Patient limited by pain    Behavior During Therapy [facility] for tasks assessed/performed           Past Medical History:  Diagnosis Date  . Anxiety   . Arthritis   . Hypercholesteremia   . Hypertension   . Pneumonia    hx of     Past Surgical History:  Procedure Laterality Date  . BUNIONECTOMY    . 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   Subjective Assessment - 12/09/19 1543    Subjective Pt missed last week's PT due to quarantining due to COVID-19 exposure. Reports he tried to work on his HEP last week but was not necessarily as consistent as he should have been.    Pertinent History R TKA  - 11/25/19    Patient Stated Goals "to be able to go back to work in 6 weeks"    Currently in Pain? No/denies    Pain Score 0-No pain   3-4/10 tightness/pressure   Pain Orientation Right    Pain Descriptors / Indicators Tightness;Pressure    Pain Type Surgical pain;Acute pain    Pain Onset --    Pain Frequency Intermittent              [facility] PT Assessment - 12/09/19 1539      Assessment   Medical Diagnosis R TKA    Referring  Provider (PT) Danae Orleans, PA-C   Paralee Cancel, MD - surgeon   Onset Date/Surgical Date 11/25/19    Next MD Visit 12/10/19      AROM   Right Knee Extension 12   9 after stretches and exercises   Right Knee Flexion 102                         [facility] Adult PT Treatment/Exercise - 12/09/19 1539      Ambulation/Gait   Ambulation/Gait Assistance 5: Supervision;4: Min guard    Ambulation/Gait Assistance Details Provided instruction in height adjustment for cane as well as proper sequencing of SPC with R LE advancement during gait    Ambulation Distance (Feet) 180 Feet    Assistive device Straight cane    Gait Pattern Step-through pattern;Right flexed knee in stance      Knee/Hip Exercises: Stretches   Passive Hamstring Stretch Right;30 seconds;2 reps    Passive Hamstring Stretch Limitations seated hip hinge    Gastroc Stretch Right;30 seconds;2 reps    Gastroc Stretch Limitations standing at wall - better tolerance today    Other Knee/Hip Stretches Seated  propped knee extension stretch 2 x 30 sec + active quad sets and/or slight overpressure on lower thigh      Knee/Hip Exercises: Aerobic   Nustep L3  x 6 min      Knee/Hip Exercises: Supine   Quad Sets Right;10 reps   5" hold   Short Arc Quad Sets Right;10 reps;AROM;Strengthening    Heel Prop for Knee Extension 1 minute    Heel Prop for Knee Extension Limitations + active quad sets    Straight Leg Raises Right;10 reps    Straight Leg Raises Limitations cues for quad set prior to initiation of lift; ~4 dg quad lag noted from available extension AROM (16 vs 12 when LE supported)    Knee Flexion Right;AAROM;10 reps;2 sets    Knee Flexion Limitations HS curls with heels on peanut ball; 2nd set with strap assist fro added knee flexion stretch      Vasopneumatic   Number Minutes Vasopneumatic  11 minutes    Vasopnuematic Location  Knee    Vasopneumatic Pressure Low    Vasopneumatic Temperature  34                   PT Education - 12/09/19 1615    Education Details Re-emphasized importance of positioning knee into full extension to avoid flexion contracture; HEP update - standing gastroc stretch, TKE at wall, seated propped knee extension stretch    Person(s) Educated Patient    Methods Explanation;Demonstration;Verbal cues;Tactile cues;Handout    Comprehension Verbalized understanding;Verbal cues required;Tactile cues required;Returned demonstration;Need further instruction            PT Short Term Goals - 12/09/19 1549      PT SHORT TERM GOAL #1   Title Patient will be independent with initial HEP    Status Partially Met    Target Date 12/11/19      PT SHORT TERM GOAL #2   Title Patient will demonstrate R knee AROM >/= 5-90 dg to increase ease of mobility    Status Partially Met   12/09/19 - R knee AROM 12-102 (extension improved to 9 by end of session)   Target Date 12/18/19      PT SHORT TERM GOAL #3   Title Patient will ambulate with good gait pattern with SPC or LRAD    Status Partially Met    Target Date 12/18/19             PT Long Term Goals - 12/09/19 1550      PT LONG TERM GOAL #1   Title Patient will be independent with ongoing/advanced HEP    Status On-going    Target Date 01/22/20      PT LONG TERM GOAL #2   Title Patient will demonstrate R knee AROM >/= 2-120 dg to allow for normal gait and stair mechanics    Status On-going    Target Date 12/23/19      PT LONG TERM GOAL #3   Title Patient will demonstrate improved R LE strength to >/= 4+/5 for improved stability and ease of mobility    Status On-going    Target Date 01/22/20      PT LONG TERM GOAL #4   Title Patient will ambulate with normal gait pattern w/o AD    Status On-going    Target Date 01/22/20      PT LONG TERM GOAL #5   Title Patient will negotiate stairs reciprocally with normal step pattern w/o limitation due to R knee  pain, LOM or weakness    Status On-going    Target Date  01/22/20                 Plan - 12/09/19 1637    Clinical Impression Statement Joneen Caraway returning to PT for first time since eval after missing last week due to quarantining following potential COVID-19 exposure. He reports he has been working on hospital issued HEP as well as stretches provided on initial eval visit but admits he has probably not been as consistent as he should. His R knee is very swollen but soft today with extensive old bruising present and slight increase in drainage apparent through post-surgical bandage as compared to initial eval. R knee flexion AROM significantly improved from eval but extension essentially unchanged (12-102 dg) but pt admitting to poor tolerance for positioning of knee in full extension at home. Reinforced stretches and exercises as well as positioning promoting improved extension ROM with extension improved to 9 dg by end of session. Also introduced gait training with SPC to initiate weaning from [place] with pt able to demonstrate good sequencing with SPC, therefore pt encouraged to start using cane within home.    Personal Factors and Comorbidities Comorbidity 3+;Transportation    Comorbidities h/o R foot bunionectomy, HTN, h/o acute respiratory failure d/t COVID-19, sepsis, anxiety, depression, HOH with hearing aids    Examination-Activity Limitations Bathing;Bed Mobility;Bend;Caring for Others;Carry;Dressing;Hygiene/Grooming;Locomotion Level;Sit;Sleep;Squat;Stairs;Stand;Toileting;Transfers    Examination-Participation Restrictions Community Activity;Driving;Interpersonal Relationship;Occupation;Shop;Yard Work    Dispensing optician    PT Frequency 2x / week   Pt only able to come 2x/wk due to wife's work schedule and wife providing transportation   PT Duration 8 weeks    PT Treatment/Interventions ADLs/Self Care Home Management;Cryotherapy;Electrical Stimulation;Iontophoresis 10m/ml Dexamethasone;Moist Heat;DME Instruction;Gait training;Stair  training;Functional mobility training;Therapeutic activities;Therapeutic exercise;Balance training;Neuromuscular re-education;Patient/family education;Manual techniques;Scar mobilization;Passive range of motion;Dry needling;Taping;Vasopneumatic Device;Joint Manipulations    PT Next Visit Plan R knee ROM; LE strengthening; gait training with SPC; manual therapy and modalities PRN for pain, ROM and edema management    PT Home Exercise Plan 9/2 - seated HS & gastroc stretches added to hospital D/C HEP; 9/14 - standing gastroc stretch, TKE at wall, seated propped knee extension stretch    Consulted and Agree with Plan of Care Patient           Patient will benefit from skilled therapeutic intervention in order to improve the following deficits and impairments:  Abnormal gait, Decreased activity tolerance, Decreased balance, Decreased knowledge of precautions, Decreased knowledge of use of DME, Decreased mobility, Decreased range of motion, Decreased safety awareness, Decreased scar mobility, Decreased strength, Difficulty walking, Increased edema, Increased fascial restricitons, Increased muscle spasms, Impaired perceived functional ability, Impaired flexibility, Impaired sensation, Pain  Visit Diagnosis: Acute pain of right knee  Stiffness of right knee, not elsewhere classified  Muscle weakness (generalized)  Other abnormalities of gait and mobility  Difficulty in walking, not elsewhere classified     Problem List Patient Active Problem List   Diagnosis Date Noted  . 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 Sepsis [place] 07/12/2018  . COVID-19 virus infection 07/12/2018  . [place] with [place] ([place] 07/12/2018  . Depression 07/12/2018  . Hypercholesteremia   . Hypertension   . Acute respiratory disease due to COVID-19 virus     JPercival Spanish PT, MPT 12/09/2019, 6:29 PM  [facility] [place] [address]Phone: [phone]  Fax:  [phone]  Name: Brent Ho MRN: [id number] Date of Birth: 01-Jul-1945

## 2019-12-11 ENCOUNTER — Ambulatory Visit: Payer: Medicare Other

## 2019-12-11 ENCOUNTER — Other Ambulatory Visit: Payer: Self-pay

## 2019-12-11 DIAGNOSIS — M6281 Muscle weakness (generalized): Secondary | ICD-10-CM

## 2019-12-11 DIAGNOSIS — M25561 Pain in right knee: Secondary | ICD-10-CM

## 2019-12-11 DIAGNOSIS — R2689 Other abnormalities of gait and mobility: Secondary | ICD-10-CM

## 2019-12-11 DIAGNOSIS — M25661 Stiffness of right knee, not elsewhere classified: Secondary | ICD-10-CM

## 2019-12-11 DIAGNOSIS — R262 Difficulty in walking, not elsewhere classified: Secondary | ICD-10-CM

## 2019-12-11 NOTE — Therapy (Signed)
[facility] [address]  [place], [place], [address] Phone: [phone]   Fax:  [phone]  Physical Therapy Treatment  Patient Details  Name: Brent Ho MRN: [id number] Date of Birth: 11/01/1945 Referring Provider (PT): Danae Orleans, PA-C Paralee Cancel, MD - surgeon)   Encounter Date: 12/11/2019   PT End of Session - 12/11/19 1539    Visit Number 3    Number of Visits 16    Date for PT Re-Evaluation 01/22/20    Authorization Type [employer] Medicare    PT Start Time 1532    PT Stop Time 1630    PT Time Calculation (min) 58 min    Activity Tolerance Patient tolerated treatment well;Patient limited by pain    Behavior During Therapy [facility] for tasks assessed/performed           Past Medical History:  Diagnosis Date  . Anxiety   . Arthritis   . Hypercholesteremia   . Hypertension   . Pneumonia    hx of     Past Surgical History:  Procedure Laterality Date  . BUNIONECTOMY    . 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   Subjective Assessment - 12/11/19 1545    Subjective Pt. noting MD took fluid off his knee at recent f/u.    Pertinent History R TKA  - 11/25/19    Patient Stated Goals "to be able to go back to work in 6 weeks"    Currently in Pain? No/denies    Pain Score 2    up to 6/10 at most   Pain Location Knee    Pain Orientation Right    Pain Descriptors / Indicators Tightness    Pain Type Surgical pain;Acute pain    Pain Frequency Intermittent    Multiple Pain Sites No              Self-care: Supervising PT and therapist inspecting R knee swelling and with discussion with pt. Regarding recent f/u with MD where MD removed fluid from knee.  R knee still with increased swelling however some improvement as  compared to last session with bandaging removed by MD.               [facility] Adult PT Treatment/Exercise - 12/11/19 0001      Knee/Hip Exercises: Stretches   Gastroc Stretch Right;30 seconds;2 reps    Gastroc Stretch Limitations standing at wall       Knee/Hip Exercises: Aerobic   Recumbent Bike Lvl 1, 6 min - half revolutions       Knee/Hip Exercises: Standing   Terminal Knee Extension Right;10 reps    Terminal Knee Extension Limitations ball squeeze       Knee/Hip Exercises: Supine   Quad Sets Right;10 reps    Quad Sets Limitations 5" hold seated in chair + heel prop    Straight Leg Raises Right;10 reps    Straight Leg Raises Limitations cues for quad set    Knee Flexion Right;AAROM;10 reps;1 set    Knee Flexion Limitations heels on peanut p-ball + strap       Vasopneumatic   Number Minutes Vasopneumatic  10 minutes    Vasopnuematic Location  Knee    Vasopneumatic Pressure Low    Vasopneumatic Temperature  34  PT Short Term Goals - 12/09/19 1549      PT SHORT TERM GOAL #1   Title Patient will be independent with initial HEP    Status Partially Met    Target Date 12/11/19      PT SHORT TERM GOAL #2   Title Patient will demonstrate R knee AROM >/= 5-90 dg to increase ease of mobility    Status Partially Met   12/09/19 - R knee AROM 12-102 (extension improved to 9 by end of session)   Target Date 12/18/19      PT SHORT TERM GOAL #3   Title Patient will ambulate with good gait pattern with SPC or LRAD    Status Partially Met    Target Date 12/18/19             PT Long Term Goals - 12/09/19 1550      PT LONG TERM GOAL #1   Title Patient will be independent with ongoing/advanced HEP    Status On-going    Target Date 01/22/20      PT LONG TERM GOAL #2   Title Patient will demonstrate R knee AROM >/= 2-120 dg to allow for normal gait and stair mechanics    Status On-going    Target Date 12/23/19      PT LONG TERM GOAL  #3   Title Patient will demonstrate improved R LE strength to >/= 4+/5 for improved stability and ease of mobility    Status On-going    Target Date 01/22/20      PT LONG TERM GOAL #4   Title Patient will ambulate with normal gait pattern w/o AD    Status On-going    Target Date 01/22/20      PT LONG TERM GOAL #5   Title Patient will negotiate stairs reciprocally with normal step pattern w/o limitation due to R knee pain, LOM or weakness    Status On-going    Target Date 01/22/20                 Plan - 12/11/19 1539    Clinical Impression Statement Brent Ho reporting MD removed his bandage from R knee and "took fluid of the knee" at recent f/u.  Pt. R knee with still with significant increased swelling however supervising PT inspecting knee and noting no signs of infection.  Progressed R knee ROM and gentle LE strengthening with good tolerance today.  Ended visit with ice/compression to R knee to reduce post-exercise swelling.     Comorbidities h/o R foot bunionectomy, HTN, h/o acute respiratory failure d/t COVID-19, sepsis, anxiety, depression, HOH with hearing aids    Rehab Potential Excellent    PT Treatment/Interventions ADLs/Self Care Home Management;Cryotherapy;Electrical Stimulation;Iontophoresis 53m/ml Dexamethasone;Moist Heat;DME Instruction;Gait training;Stair training;Functional mobility training;Therapeutic activities;Therapeutic exercise;Balance training;Neuromuscular re-education;Patient/family education;Manual techniques;Scar mobilization;Passive range of motion;Dry needling;Taping;Vasopneumatic Device;Joint Manipulations    PT Next Visit Plan R knee ROM; LE strengthening; gait training with SPC; manual therapy and modalities PRN for pain, ROM and edema management    PT Home Exercise Plan 9/2 - seated HS & gastroc stretches added to hospital D/C HEP; 9/14 - standing gastroc stretch, TKE at wall, seated propped knee extension stretch    Consulted and Agree with Plan of Care  Patient    Family Member Consulted wife - BHorris Latino          Patient will benefit from skilled therapeutic intervention in order to improve the following deficits and impairments:  Abnormal gait, Decreased activity tolerance,  Decreased balance, Decreased knowledge of precautions, Decreased knowledge of use of DME, Decreased mobility, Decreased range of motion, Decreased safety awareness, Decreased scar mobility, Decreased strength, Difficulty walking, Increased edema, Increased fascial restricitons, Increased muscle spasms, Impaired perceived functional ability, Impaired flexibility, Impaired sensation, Pain  Visit Diagnosis: Acute pain of right knee  Stiffness of right knee, not elsewhere classified  Muscle weakness (generalized)  Other abnormalities of gait and mobility  Difficulty in walking, not elsewhere classified     Problem List Patient Active Problem List   Diagnosis Date Noted  . 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 Sepsis ([place]) 07/12/2018  . COVID-19 virus infection 07/12/2018  . Acute respiratory failure with hypoxia ([place]) 07/12/2018  . Depression 07/12/2018  . Hypercholesteremia   . Hypertension   . Acute respiratory disease due to COVID-19 virus     Brent Ho, PTA 12/11/19 6:15 PM   [facility] [address]  [place], [place], [address] Phone: [phone]   Fax:  [phone]  Name: Brent Ho MRN: [id number] Date of Birth: 1946-02-08

## 2019-12-16 ENCOUNTER — Ambulatory Visit: Payer: Medicare Other | Admitting: Physical Therapy

## 2019-12-16 ENCOUNTER — Other Ambulatory Visit: Payer: Self-pay

## 2019-12-16 ENCOUNTER — Encounter: Payer: Self-pay | Admitting: Physical Therapy

## 2019-12-16 DIAGNOSIS — M6281 Muscle weakness (generalized): Secondary | ICD-10-CM

## 2019-12-16 DIAGNOSIS — M25661 Stiffness of right knee, not elsewhere classified: Secondary | ICD-10-CM

## 2019-12-16 DIAGNOSIS — R2689 Other abnormalities of gait and mobility: Secondary | ICD-10-CM

## 2019-12-16 DIAGNOSIS — M25561 Pain in right knee: Secondary | ICD-10-CM

## 2019-12-16 DIAGNOSIS — R262 Difficulty in walking, not elsewhere classified: Secondary | ICD-10-CM

## 2019-12-16 NOTE — Therapy (Signed)
[facility] [place] [address]  [place], [place], [address] Phone: [phone]   Fax:  [phone]  Physical Therapy Treatment  Patient Details  Name: Brent Ho MRN: [id number] Date of Birth: 09-19-1945 Referring Provider (PT): Danae Orleans, PA-C Paralee Cancel, MD - surgeon)   Encounter Date: 12/16/2019   PT End of Session - 12/16/19 1524    Visit Number 4    Number of Visits 16    Date for PT Re-Evaluation 01/22/20    Authorization Type [employer] Medicare    PT Start Time 1524    PT Stop Time 1621    PT Time Calculation (min) 57 min    Activity Tolerance Patient tolerated treatment well;Patient limited by pain    Behavior During Therapy [facility] for tasks assessed/performed           Past Medical History:  Diagnosis Date  . Anxiety   . Arthritis   . Hypercholesteremia   . Hypertension   . Pneumonia    hx of     Past Surgical History:  Procedure Laterality Date  . BUNIONECTOMY    . 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   Subjective Assessment - 12/16/19 1528    Subjective Pt states his goal is to be able to do full revolutions on the recumbent bike today.    Pertinent History R TKA  - 11/25/19    Patient Stated Goals "to be able to go back to work in 6 weeks"    Currently in Pain? No/denies              [facility] PT Assessment - 12/16/19 1524      Assessment   Medical Diagnosis R TKA    Referring Provider (PT) Danae Orleans, PA-C   Paralee Cancel, MD - surgeon   Onset Date/Surgical Date 11/25/19    Next MD Visit 01/08/20      AROM   Right Knee Extension 7    Right Knee Flexion 110                         [facility] Adult PT Treatment/Exercise - 12/16/19 1524      Exercises   Exercises Knee/Hip       Knee/Hip Exercises: Aerobic   Recumbent Bike partial revolutions to reverse to fwd full revolutions x 2 min; L1 x 4 min      Knee/Hip Exercises: Standing   Hip Flexion Right;Left;10 reps;Stengthening;Knee straight    Hip Flexion Limitations red TB, cues for quad set to maintain knee extension; 2 pole A for balance    Terminal Knee Extension Right;20 reps;Strengthening;Theraband    Theraband Level (Terminal Knee Extension) Level 4 (Blue)    Hip ADduction Right;Left;10 reps;Strengthening    Hip ADduction Limitations red TB; 2 pole A for balance    Hip Abduction Right;Left;10 reps;Stengthening;Knee straight    Abduction Limitations red TB; 2 pole A for balance    Hip Extension Right;Left;10 reps;Stengthening;Knee straight    Extension Limitations red TB, cues for quad set to maintain knee extension; 2 pole A for balance    Lateral Step Up Right;10 reps;Step Height: 6";Hand Hold: 2    Forward Step Up Right;10 reps;2 sets;Step Height: 6";Step Height: 8";Hand Hold: 2  Forward Step Up Limitations cues for TKE on R before placing L foot on step      Knee/Hip Exercises: Seated   Long Arc Quad Right;10 reps;Strengthening    Long Arc Quad Limitations red TB    Other Seated Knee/Hip Exercises R Fitter leg press (1 Schwering/1 blue) x 20    Hamstring Curl Right;15 reps;Strengthening    Hamstring Limitations red TB      Vasopneumatic   Number Minutes Vasopneumatic  12 minutes    Vasopnuematic Location  Knee    Vasopneumatic Pressure Low    Vasopneumatic Temperature  34                  PT Education - 12/16/19 1618    Education Details HEP update - blue TB TKE; seated red TB HS curl & knee extension, standing red TB 4-way SLR    Person(s) Educated Patient;Spouse    Methods Explanation;Demonstration;Verbal cues;Handout    Comprehension Verbalized understanding;Verbal cues required;Returned demonstration;Need further instruction            PT Short Term Goals - 12/16/19 1531       PT SHORT TERM GOAL #1   Title Patient will be independent with initial HEP    Status Achieved   12/16/19     PT SHORT TERM GOAL #2   Title Patient will demonstrate R knee AROM >/= 5-90 dg to increase ease of mobility    Status Partially Met   12/09/19 - R knee AROM 12-102 (extension improved to 9 by end of session)   Target Date 12/18/19      PT SHORT TERM GOAL #3   Title Patient will ambulate with good gait pattern with SPC or LRAD    Status Partially Met    Target Date 12/18/19             PT Long Term Goals - 12/09/19 1550      PT LONG TERM GOAL #1   Title Patient will be independent with ongoing/advanced HEP    Status On-going    Target Date 01/22/20      PT LONG TERM GOAL #2   Title Patient will demonstrate R knee AROM >/= 2-120 dg to allow for normal gait and stair mechanics    Status On-going    Target Date 12/23/19      PT LONG TERM GOAL #3   Title Patient will demonstrate improved R LE strength to >/= 4+/5 for improved stability and ease of mobility    Status On-going    Target Date 01/22/20      PT LONG TERM GOAL #4   Title Patient will ambulate with normal gait pattern w/o AD    Status On-going    Target Date 01/22/20      PT LONG TERM GOAL #5   Title Patient will negotiate stairs reciprocally with normal step pattern w/o limitation due to R knee pain, LOM or weakness    Status On-going    Target Date 01/22/20                 Plan - 12/16/19 1532    Clinical Impression Statement Castiel is very pleased that he was able to achieve full revolutions on the recumbent bike today. He reports he has started self-weaning from the cane in his home and notes he has started trying to lead with his L foot on the stairs, therefore introduced step-ups targeting quad activation and TKE along with progression of standing strengthening  exercises for hip and knee. Pt requiring close supervision and monitoring for pacing and to limit reps to avoid excessive post-exercise  muscle soreness. L knee edema appeared to lessen with exercises and AROM continues to improve - currently 7-110 dg. Session concluded with vasopnuematic compression to reduce post-exercise edema and pain.    Comorbidities h/o R foot bunionectomy, HTN, h/o acute respiratory failure d/t COVID-19, sepsis, anxiety, depression, HOH with hearing aids    Rehab Potential Excellent    PT Treatment/Interventions ADLs/Self Care Home Management;Cryotherapy;Electrical Stimulation;Iontophoresis 28m/ml Dexamethasone;Moist Heat;DME Instruction;Gait training;Stair training;Functional mobility training;Therapeutic activities;Therapeutic exercise;Balance training;Neuromuscular re-education;Patient/family education;Manual techniques;Scar mobilization;Passive range of motion;Dry needling;Taping;Vasopneumatic Device;Joint Manipulations    PT Next Visit Plan R knee ROM; LE strengthening; gait training w/o AD; manual therapy and modalities PRN for pain, ROM and edema management    PT Home Exercise Plan 9/2 - seated HS & gastroc stretches added to hospital D/C HEP; 9/14 - standing gastroc stretch, TKE at wall, seated propped knee extension stretch; 9/21 - blue TB TKE; seated red TB HS curl & knee extension, standing red TB 4-way SLR    Consulted and Agree with Plan of Care Patient    Family Member Consulted wife - BHorris Latino          Patient will benefit from skilled therapeutic intervention in order to improve the following deficits and impairments:  Abnormal gait, Decreased activity tolerance, Decreased balance, Decreased knowledge of precautions, Decreased knowledge of use of DME, Decreased mobility, Decreased range of motion, Decreased safety awareness, Decreased scar mobility, Decreased strength, Difficulty walking, Increased edema, Increased fascial restricitons, Increased muscle spasms, Impaired perceived functional ability, Impaired flexibility, Impaired sensation, Pain  Visit Diagnosis: Acute pain of right  knee  Stiffness of right knee, not elsewhere classified  Muscle weakness (generalized)  Other abnormalities of gait and mobility  Difficulty in walking, not elsewhere classified     Problem List Patient Active Problem List   Diagnosis Date Noted  . 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 S[place]is [place] 07/12/2018  . COVID-19 virus infection 07/12/2018  . Acute respiratory failure with hypoxia ([place] 07/12/2018  . Depression 07/12/2018  . Hypercholesteremia   . Hypertension   . Acute respiratory disease due to COVID-19 virus     JPercival Spanish PT, MPT 12/16/2019, 7:23 PM  [facility] [place] [address]Phone: [phone]  Fax:  [phone] Name: RAleksandr PellowMRN: [id number] of Birth: [birth year]

## 2019-12-16 NOTE — Patient Instructions (Signed)
    Home exercise program created by Unity Luepke, PT.  For questions, please contact Makayli Bracken via phone at [phone] or email at [email or web address]  [facility] [place] [address]  [address] [place], [place], [address] Phone: [phone]   Fax:  [phone]    

## 2019-12-18 ENCOUNTER — Ambulatory Visit: Payer: Medicare Other | Admitting: Physical Therapy

## 2019-12-18 ENCOUNTER — Encounter: Payer: Self-pay | Admitting: Physical Therapy

## 2019-12-18 ENCOUNTER — Other Ambulatory Visit: Payer: Self-pay

## 2019-12-18 DIAGNOSIS — M25561 Pain in right knee: Secondary | ICD-10-CM

## 2019-12-18 DIAGNOSIS — M25661 Stiffness of right knee, not elsewhere classified: Secondary | ICD-10-CM

## 2019-12-18 DIAGNOSIS — R262 Difficulty in walking, not elsewhere classified: Secondary | ICD-10-CM

## 2019-12-18 DIAGNOSIS — M6281 Muscle weakness (generalized): Secondary | ICD-10-CM

## 2019-12-18 DIAGNOSIS — R2689 Other abnormalities of gait and mobility: Secondary | ICD-10-CM

## 2019-12-18 NOTE — Therapy (Signed)
[facility] [place] [address]  [place], [place], [address] Phone: [phone]   Fax:  [phone]  Physical Therapy Treatment  Patient Details  Name: Brent Ho MRN: [id number] Date of Birth: 12-30-1945 Referring Provider (PT): Danae Orleans, PA-C Paralee Cancel, MD - surgeon)   Encounter Date: 12/18/2019   PT End of Session - 12/18/19 1520    Visit Number 5    Number of Visits 16    Date for PT Re-Evaluation 01/22/20    Authorization Type [employer] Medicare    PT Start Time 1520    PT Stop Time 1617    PT Time Calculation (min) 57 min    Activity Tolerance Patient tolerated treatment well;Patient limited by pain    Behavior During Therapy [facility] for tasks assessed/performed           Past Medical History:  Diagnosis Date  . Anxiety   . Arthritis   . Hypercholesteremia   . Hypertension   . Pneumonia    hx of     Past Surgical History:  Procedure Laterality Date  . BUNIONECTOMY    . 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   Subjective Assessment - 12/18/19 1522    Subjective Pt reports he has done "all kinds of walking today".    Pertinent History R TKA  - 11/25/19    Patient Stated Goals "to be able to go back to work in 6 weeks"    Currently in Pain? No/denies                             [facility] Adult PT Treatment/Exercise - 12/18/19 1520      Ambulation/Gait   Ambulation/Gait Assistance 7: Independent    Ambulation/Gait Assistance Details Cues for heel strike & heel-toe progression during stace phase along with increased hip and knee flexion during swing phase.    Ambulation Distance (Feet) 180 Feet    Assistive device None    Stairs Yes    Stairs Assistance 6: Modified independent (Device/Increase time)      Stair Management Technique No rails;One rail Right;Alternating pattern;Forwards    Gait Comments Slight R hip hike noted at stair ascent but able to correct with cues for increased hip & knee flexion. Slight decreased R quad eccentric control noted on descent.      Self-Care   Self-Care Scar Mobilizations    Scar Mobilizations Provided instruction in R knee incisional scar mobilization to proximal half of incision avoiding distal portion where slight scabbing still persists. Also instructed pt to avoid use of any creams or lotions until scabbing gone.       Exercises   Exercises Knee/Hip      Knee/Hip Exercises: Stretches   ITB Stretch Right;30 seconds;2 reps    ITB Stretch Limitations supine crossbody with strap      Knee/Hip Exercises: Aerobic   Recumbent Bike L1 x 6 min      Knee/Hip Exercises: Machines for Strengthening   Cybex Leg Press B LE 20# x 15      Knee/Hip Exercises: Standing   Heel Raises Both;10 reps;5 seconds    Heel Raises Limitations cues for quad & glute sets to encourage maximal knee extension  Hip Flexion Right;Left;10 reps;Stengthening;Knee straight    Hip Flexion Limitations red TB, cues for quad set to maintain knee extension; SPC & 1 pole A for balance    Terminal Knee Extension Right;20 reps;Strengthening;Theraband   5" hold   Theraband Level (Terminal Knee Extension) Level 4 (Blue)    Terminal Knee Extension Limitations cues for quad & glute set pushing heel down into floor & curing toes up to maximize knee extension    Hip ADduction Right;Left;10 reps;Strengthening    Hip ADduction Limitations red TB, SPC & 1 pole A for balance    Hip Abduction Right;Left;10 reps;Stengthening;Knee straight    Abduction Limitations red TB, SPC & 1 pole A for balance    Hip Extension Right;Left;10 reps;Stengthening;Knee straight    Extension Limitations red TB, cues for quad set to maintain knee extension; SPC & 1 pole A for balance    Step Down Right;10 reps;Step  Height: 6";Hand Hold: 2    Step Down Limitations lateral eccentric lowering with light floor touch    Functional Squat 10 reps;5 seconds    Functional Squat Limitations supported squat at counter      Vasopneumatic   Number Minutes Vasopneumatic  10 minutes    Vasopnuematic Location  Knee    Vasopneumatic Pressure Medium    Vasopneumatic Temperature  34      Manual Therapy   Manual Therapy Soft tissue mobilization;Joint mobilization    Joint Mobilization R knee patellar mobs - all directions (most limited inf/sup)    Soft tissue mobilization R knee incisional scar mobilization to proximal half of incision avoiding distal portion where slight scabbing still persists.                     PT Short Term Goals - 12/18/19 1525      PT SHORT TERM GOAL #1   Title Patient will be independent with initial HEP    Status Achieved   12/16/19     PT SHORT TERM GOAL #2   Title Patient will demonstrate R knee AROM >/= 5-90 dg to increase ease of mobility    Status Partially Met   12/16/19 - R knee AROM 7-110 dg   Target Date 12/18/19      PT SHORT TERM GOAL #3   Title Patient will ambulate with good gait pattern with SPC or LRAD    Status Achieved   12/18/19            PT Long Term Goals - 12/18/19 1526      PT LONG TERM GOAL #1   Title Patient will be independent with ongoing/advanced HEP    Status Partially Met    Target Date 01/22/20      PT LONG TERM GOAL #2   Title Patient will demonstrate R knee AROM >/= 2-120 dg to allow for normal gait and stair mechanics    Status On-going    Target Date 01/22/20      PT LONG TERM GOAL #3   Title Patient will demonstrate improved R LE strength to >/= 4+/5 for improved stability and ease of mobility    Status On-going    Target Date 01/22/20      PT LONG TERM GOAL #4   Title Patient will ambulate with normal gait pattern w/o AD    Status Partially Met    Target Date 01/22/20      PT LONG TERM GOAL #5   Title Patient will  negotiate stairs reciprocally with  normal step pattern w/o limitation due to R knee pain, LOM or weakness    Status Partially Met    Target Date 01/22/20                 Plan - 12/18/19 1525    Clinical Impression Statement Brent Ho reports he has been walking more and using his bike at home to keep his knee moving but still finds that his knee stiffens up when he sits for a while - reassured pt that this is normal at this stage in post-op recovery. Reviewed most recent HEP update providing clarification for blue TB TKE and confirming proper pattern/technique for 4-way hip exercises. Continued strengthening progression targeting quad strength/control to promote knee extension and reduce risk for knee buckling. Patient able to demonstrate good gait pattern with and w/o SPC (STG #3 met) but still demonstrates slight compensations with stair ascent and descent. R knee edema continues to decline and incision healing well with only very minor scabbing still present at distal patella and lower end of incision, therefore initiated incisional scar tissue mobilization cautioning pt to only address upper half of incision until no scabs remaining along distal incision.    Comorbidities h/o R foot bunionectomy, HTN, h/o acute respiratory failure d/t COVID-19, sepsis, anxiety, depression, HOH with hearing aids    Rehab Potential Excellent    PT Frequency 2x / week    PT Duration 8 weeks    PT Treatment/Interventions ADLs/Self Care Home Management;Cryotherapy;Electrical Stimulation;Iontophoresis 47m/ml Dexamethasone;Moist Heat;DME Instruction;Gait training;Stair training;Functional mobility training;Therapeutic activities;Therapeutic exercise;Balance training;Neuromuscular re-education;Patient/family education;Manual techniques;Scar mobilization;Passive range of motion;Dry needling;Taping;Vasopneumatic Device;Joint Manipulations    PT Next Visit Plan R knee ROM; LE strengthening; gait training w/o AD; manual  therapy and modalities PRN for pain, ROM and edema management    PT Home Exercise Plan 9/2 - seated HS & gastroc stretches added to hospital D/C HEP; 9/14 - standing gastroc stretch, TKE at wall, seated propped knee extension stretch; 9/21 - blue TB TKE; seated red TB HS curl & knee extension, standing red TB 4-way SLR    Consulted and Agree with Plan of Care Patient    Family Member Consulted wife - BHorris Latino          Patient will benefit from skilled therapeutic intervention in order to improve the following deficits and impairments:  Abnormal gait, Decreased activity tolerance, Decreased balance, Decreased knowledge of precautions, Decreased knowledge of use of DME, Decreased mobility, Decreased range of motion, Decreased safety awareness, Decreased scar mobility, Decreased strength, Difficulty walking, Increased edema, Increased fascial restricitons, Increased muscle spasms, Impaired perceived functional ability, Impaired flexibility, Impaired sensation, Pain  Visit Diagnosis: Acute pain of right knee  Stiffness of right knee, not elsewhere classified  Muscle weakness (generalized)  Other abnormalities of gait and mobility  Difficulty in walking, not elsewhere classified     Problem List Patient Active Problem List   Diagnosis Date Noted  . 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 Sepsis [place] 07/12/[place]  . COVID-19 virus infection 07/12/2018  . Acute respiratory failure with hypoxia ([place] 07/12/2018  . Depression 07/12/2018  . Hypercholesteremia   . Hypertension   . Acute respiratory disease due to COVID-19 virus     JPercival Spanish PT, MPT 12/18/2019, 6:44 PM  [facility] [place] [address]Phone: [phone]  Fax:  [phone] Name: RGoebel HellumsMRN: [id number] of Birth: [birth year]

## 2019-12-23 ENCOUNTER — Ambulatory Visit: Payer: Medicare Other

## 2019-12-23 ENCOUNTER — Other Ambulatory Visit: Payer: Self-pay

## 2019-12-23 DIAGNOSIS — M25561 Pain in right knee: Secondary | ICD-10-CM | POA: Diagnosis not present

## 2019-12-23 DIAGNOSIS — R2689 Other abnormalities of gait and mobility: Secondary | ICD-10-CM

## 2019-12-23 DIAGNOSIS — R262 Difficulty in walking, not elsewhere classified: Secondary | ICD-10-CM

## 2019-12-23 DIAGNOSIS — M6281 Muscle weakness (generalized): Secondary | ICD-10-CM

## 2019-12-23 DIAGNOSIS — M25661 Stiffness of right knee, not elsewhere classified: Secondary | ICD-10-CM

## 2019-12-23 NOTE — Therapy (Signed)
[facility] [address]  [place], [place], [address] Phone: [phone]   Fax:  [phone]  Physical Therapy Treatment  Patient Details  Name: Brent Ho MRN: [id number] Date of Birth: 09/16/1945 Referring Provider (PT): Danae Orleans, PA-C   Encounter Date: 12/23/2019   PT End of Session - 12/23/19 1606    Visit Number 6    Number of Visits 16    Date for PT Re-Evaluation 01/22/20    Authorization Type UHC Medicare    PT Start Time 6803    PT Stop Time 1609    PT Time Calculation (min) 38 min    Activity Tolerance Patient tolerated treatment well;Patient limited by pain    Behavior During Therapy [facility] for tasks assessed/performed           Past Medical History:  Diagnosis Date  . Anxiety   . Arthritis   . Hypercholesteremia   . Hypertension   . Pneumonia    hx of     Past Surgical History:  Procedure Laterality Date  . BUNIONECTOMY    . 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       [facility] PT Assessment - 12/23/19 0001      Assessment   Medical Diagnosis R TKA    Referring Provider (PT) Danae Orleans, PA-C    Onset Date/Surgical Date 11/25/19    Hand Dominance Right    Next MD Visit 01/08/20      AROM   AROM Assessment Site Knee    Right/Left Knee Right    Right Knee Extension 7    Right Knee Flexion 118      PROM   PROM Assessment Site Knee    Right/Left Knee Right    Right Knee Extension 6    Right Knee Flexion 120                         [facility] Adult PT Treatment/Exercise - 12/23/19 0001      Knee/Hip Exercises: Stretches   Passive Hamstring Stretch Right;2 reps;20 seconds    Passive Hamstring Stretch Limitations supine with strap     Hip Flexor Stretch Right;1 rep;30 seconds      Hip Flexor Stretch Limitations mod thomas position     ITB Stretch Right;30 seconds;1 rep    ITB Stretch Limitations supine crossbody with strap    Piriformis Stretch Left;30 seconds;2 reps    Piriformis Stretch Limitations KTOS      Knee/Hip Exercises: Aerobic   Recumbent Bike L1 x 6 min      Knee/Hip Exercises: Standing   Wall Squat 10 reps;3 seconds    Wall Squat Limitations cues for even weight bearing       Knee/Hip Exercises: Supine   Bridges Both;15 reps    Bridges Limitations cues for increased height       Vasopneumatic   Number Minutes Vasopneumatic  10 minutes    Vasopnuematic Location  Knee    Vasopneumatic Pressure Medium    Vasopneumatic Temperature  34      Manual Therapy   Manual Therapy Joint mobilization    Manual therapy comments seated     Joint Mobilization R knee patellar mobs - all directions (most limited inf/sup)  PT Short Term Goals - 12/18/19 1525      PT SHORT TERM GOAL #1   Title Patient will be independent with initial HEP    Status Achieved   12/16/19     PT SHORT TERM GOAL #2   Title Patient will demonstrate R knee AROM >/= 5-90 dg to increase ease of mobility    Status Partially Met   12/16/19 - R knee AROM 7-110 dg   Target Date 12/18/19      PT SHORT TERM GOAL #3   Title Patient will ambulate with good gait pattern with SPC or LRAD    Status Achieved   12/18/19            PT Long Term Goals - 12/18/19 1526      PT LONG TERM GOAL #1   Title Patient will be independent with ongoing/advanced HEP    Status Partially Met    Target Date 01/22/20      PT LONG TERM GOAL #2   Title Patient will demonstrate R knee AROM >/= 2-120 dg to allow for normal gait and stair mechanics    Status On-going    Target Date 01/22/20      PT LONG TERM GOAL #3   Title Patient will demonstrate improved R LE strength to >/= 4+/5 for improved stability and ease of mobility    Status On-going    Target Date 01/22/20       PT LONG TERM GOAL #4   Title Patient will ambulate with normal gait pattern w/o AD    Status Partially Met    Target Date 01/22/20      PT LONG TERM GOAL #5   Title Patient will negotiate stairs reciprocally with normal step pattern w/o limitation due to R knee pain, LOM or weakness    Status Partially Met    Target Date 01/22/20                 Plan - 12/23/19 1608    Clinical Impression Statement Brent Ho doing well.  Able to demonstrate good program toward LTG #2 with R knee AROM/PROM flexion progressing (see measurements).  Progressed to wall sits without with pt. noting good quad muscle challenge and ended visit with ice/compression to R knee to reduce post-exercise swelling.  Brent Ho now noting he walks at home without [facility] and feels stable.     Comorbidities h/o R foot bunionectomy, HTN, h/o acute respiratory failure d/t COVID-19, sepsis, anxiety, depression, HOH with hearing aids    Rehab Potential Excellent    PT Frequency 2x / week    PT Duration 8 weeks    PT Treatment/Interventions ADLs/Self Care Home Management;Cryotherapy;Electrical Stimulation;Iontophoresis 43m/ml Dexamethasone;Moist Heat;DME Instruction;Gait training;Stair training;Functional mobility training;Therapeutic activities;Therapeutic exercise;Balance training;Neuromuscular re-education;Patient/family education;Manual techniques;Scar mobilization;Passive range of motion;Dry needling;Taping;Vasopneumatic Device;Joint Manipulations    PT Next Visit Plan R knee ROM; LE strengthening; gait training w/o AD; manual therapy and modalities PRN for pain, ROM and edema management    PT Home Exercise Plan 9/2 - seated HS & gastroc stretches added to hospital D/C HEP; 9/14 - standing gastroc stretch, TKE at wall, seated propped knee extension stretch; 9/21 - blue TB TKE; seated red TB HS curl & knee extension, standing red TB 4-way SLR    Consulted and Agree with Plan of Care Patient           Patient will benefit from  skilled therapeutic intervention in order to improve the following deficits and impairments:  Abnormal  gait, Decreased activity tolerance, Decreased balance, Decreased knowledge of precautions, Decreased knowledge of use of DME, Decreased mobility, Decreased range of motion, Decreased safety awareness, Decreased scar mobility, Decreased strength, Difficulty walking, Increased edema, Increased fascial restricitons, Increased muscle spasms, Impaired perceived functional ability, Impaired flexibility, Impaired sensation, Pain  Visit Diagnosis: Acute pain of right knee  Stiffness of right knee, not elsewhere classified  Muscle weakness (generalized)  Other abnormalities of gait and mobility  Difficulty in walking, not elsewhere classified     Problem List Patient Active Problem List   Diagnosis Date Noted  . 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 Sepsis ([place]) 07/12/2018  . COVID-19 virus infection 07/12/2018  . Acute respiratory failure with hypoxia ([place]) 07/12/2018  . Depression 07/12/2018  . Hypercholesteremia   . Hypertension   . Acute respiratory disease due to COVID-19 virus     Bess Harvest, PTA 12/23/19 6:13 PM   [facility] [place] [address]  [address] [place], [place], [address] Phone: [phone]   Fax:  [phone]  Name: Brent Ho MRN: [id number] Date of Birth: [birth year]

## 2019-12-25 ENCOUNTER — Ambulatory Visit: Payer: Medicare Other

## 2019-12-25 ENCOUNTER — Other Ambulatory Visit: Payer: Self-pay

## 2019-12-25 DIAGNOSIS — R262 Difficulty in walking, not elsewhere classified: Secondary | ICD-10-CM

## 2019-12-25 DIAGNOSIS — M25661 Stiffness of right knee, not elsewhere classified: Secondary | ICD-10-CM

## 2019-12-25 DIAGNOSIS — M25561 Pain in right knee: Secondary | ICD-10-CM | POA: Diagnosis not present

## 2019-12-25 DIAGNOSIS — R2689 Other abnormalities of gait and mobility: Secondary | ICD-10-CM

## 2019-12-25 DIAGNOSIS — M6281 Muscle weakness (generalized): Secondary | ICD-10-CM

## 2019-12-25 NOTE — Therapy (Signed)
[facility] [address]  [place], [place], [address] Phone: [phone]   Fax:  [phone]  Physical Therapy Treatment  Patient Details  Name: Brent Ho MRN: [id number] Date of Birth: [birth year] Referring Provider (PT): Danae Orleans, PA-C   Encounter Date: 12/25/2019   PT End of Session - 12/25/19 1537    Visit Number 7    Number of Visits 16    Date for PT Re-Evaluation 01/22/20    Authorization Type UHC Medicare    PT Start Time 1532    PT Stop Time 1625    PT Time Calculation (min) 53 min    Activity Tolerance Patient tolerated treatment well;Patient limited by pain    Behavior During Therapy [facility] for tasks assessed/performed           Past Medical History:  Diagnosis Date  . Anxiety   . Arthritis   . Hypercholesteremia   . Hypertension   . Pneumonia    hx of     Past Surgical History:  Procedure Laterality Date  . BUNIONECTOMY    . 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   Subjective Assessment - 12/25/19 1536    Subjective Pt. doing well and denies soreness since last visit.    Pertinent History R TKA  - 11/25/19    Patient Stated Goals "to be able to go back to work in 6 weeks"    Currently in Pain? No/denies    Pain Score 0-No pain    Pain Location Knee    Multiple Pain Sites No                             [facility] Adult PT Treatment/Exercise - 12/25/19 0001      Ambulation/Gait   Ambulation/Gait Yes    Ambulation/Gait Assistance 7: Independent    Ambulation/Gait Assistance Details good heel strike and arm swing; normalizing gait without AD    Ambulation Distance (Feet) 90 Feet    Assistive device None      Knee/Hip Exercises: Stretches   Passive Hamstring Stretch Right;1 rep;30  seconds    Passive Hamstring Stretch Limitations supine with strap     Hip Flexor Stretch Right;1 rep;30 seconds    Hip Flexor Stretch Limitations mod thomas position     Knee: Self-Stretch Limitations 5" x 10 reps     ITB Stretch Right;30 seconds;1 rep    ITB Stretch Limitations supine crossbody with strap    Piriformis Stretch Right;30 seconds;2 reps    Piriformis Stretch Limitations KTOS      Knee/Hip Exercises: Aerobic   Recumbent Bike L1 x 6 min      Knee/Hip Exercises: Standing   Lateral Step Up Right;10 reps;Step Height: 8";Hand Hold: 2    Forward Step Up Right;Step Height: 8";Hand Hold: 1;10 reps    Step Down Right;10 reps;Step Height: 6";Hand Hold: 2    Step Down Limitations lateral eccentric lowering with light floor touch                    PT Short Term Goals - 12/18/19 1525      PT SHORT TERM GOAL #1   Title Patient will be independent with initial HEP  Status Achieved   12/16/19     PT SHORT TERM GOAL #2   Title Patient will demonstrate R knee AROM >/= 5-90 dg to increase ease of mobility    Status Partially Met   12/16/19 - R knee AROM 7-110 dg   Target Date 12/18/19      PT SHORT TERM GOAL #3   Title Patient will ambulate with good gait pattern with SPC or LRAD    Status Achieved   12/18/19            PT Long Term Goals - 12/18/19 1526      PT LONG TERM GOAL #1   Title Patient will be independent with ongoing/advanced HEP    Status Partially Met    Target Date 01/22/20      PT LONG TERM GOAL #2   Title Patient will demonstrate R knee AROM >/= 2-120 dg to allow for normal gait and stair mechanics    Status On-going    Target Date 01/22/20      PT LONG TERM GOAL #3   Title Patient will demonstrate improved R LE strength to >/= 4+/5 for improved stability and ease of mobility    Status On-going    Target Date 01/22/20      PT LONG TERM GOAL #4   Title Patient will ambulate with normal gait pattern w/o AD    Status Partially Met     Target Date 01/22/20      PT LONG TERM GOAL #5   Title Patient will negotiate stairs reciprocally with normal step pattern w/o limitation due to R knee pain, LOM or weakness    Status Partially Met    Target Date 01/22/20                 Plan - 12/25/19 1538    Clinical Impression Statement Brent Ho denies soreness after last session.  Reports, "this is the best my R knee has felt".  Reports daily walking around neighborhood.  Tolerated progressed of R LE strength training along with continued 6" step-down quad strengthening well.  Notes he is performing HEP daily.  ended visit with some visible R knee swelling thus applied ice/compression to reduce post-exercise soreness and swelling.  Pt. progressing well toward LTG #2 with visible improvement in flexion AROM.    Comorbidities h/o R foot bunionectomy, HTN, h/o acute respiratory failure d/t COVID-19, sepsis, anxiety, depression, HOH with hearing aids    Rehab Potential Excellent    PT Frequency 2x / week    PT Treatment/Interventions ADLs/Self Care Home Management;Cryotherapy;Electrical Stimulation;Iontophoresis 35m/ml Dexamethasone;Moist Heat;DME Instruction;Gait training;Stair training;Functional mobility training;Therapeutic activities;Therapeutic exercise;Balance training;Neuromuscular re-education;Patient/family education;Manual techniques;Scar mobilization;Passive range of motion;Dry needling;Taping;Vasopneumatic Device;Joint Manipulations    PT Next Visit Plan R knee ROM; LE strengthening; gait training w/o AD; manual therapy and modalities PRN for pain, ROM and edema management    PT Home Exercise Plan 9/2 - seated HS & gastroc stretches added to hospital D/C HEP; 9/14 - standing gastroc stretch, TKE at wall, seated propped knee extension stretch; 9/21 - blue TB TKE; seated red TB HS curl & knee extension, standing red TB 4-way SLR    Consulted and Agree with Plan of Care Patient           Patient will benefit from skilled  therapeutic intervention in order to improve the following deficits and impairments:  Abnormal gait, Decreased activity tolerance, Decreased balance, Decreased knowledge of precautions, Decreased knowledge of use of DME, Decreased mobility, Decreased  range of motion, Decreased safety awareness, Decreased scar mobility, Decreased strength, Difficulty walking, Increased edema, Increased fascial restricitons, Increased muscle spasms, Impaired perceived functional ability, Impaired flexibility, Impaired sensation, Pain  Visit Diagnosis: Acute pain of right knee  Stiffness of right knee, not elsewhere classified  Muscle weakness (generalized)  Other abnormalities of gait and mobility  Difficulty in walking, not elsewhere classified     Problem List Patient Active Problem List   Diagnosis Date Noted  . 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 Sepsis ([place]) 07/12/2018  . COVID-19 virus infection 07/12/2018  . Acute respiratory failure with hypoxia ([place]) 07/12/2018  . Depression 07/12/2018  . Hypercholesteremia   . Hypertension   . Acute respiratory disease due to COVID-19 virus     Bess Harvest, PTA 12/25/19 5:18 PM   [facility] [address]  [place], [place], [address] Phone: [phone]   Fax:  [phone]  Name: Brent Ho MRN: [id number] Date of Birth: 1945-06-11

## 2019-12-30 ENCOUNTER — Other Ambulatory Visit: Payer: Self-pay

## 2019-12-30 ENCOUNTER — Ambulatory Visit: Payer: Medicare Other | Attending: Orthopedic Surgery

## 2019-12-30 DIAGNOSIS — M6281 Muscle weakness (generalized): Secondary | ICD-10-CM | POA: Insufficient documentation

## 2019-12-30 DIAGNOSIS — M25661 Stiffness of right knee, not elsewhere classified: Secondary | ICD-10-CM | POA: Diagnosis present

## 2019-12-30 DIAGNOSIS — R2689 Other abnormalities of gait and mobility: Secondary | ICD-10-CM | POA: Diagnosis present

## 2019-12-30 DIAGNOSIS — R262 Difficulty in walking, not elsewhere classified: Secondary | ICD-10-CM | POA: Insufficient documentation

## 2019-12-30 DIAGNOSIS — M25561 Pain in right knee: Secondary | ICD-10-CM | POA: Diagnosis present

## 2019-12-30 NOTE — Therapy (Signed)
[facility] [address]  [place], [place], [address] Phone: [phone]   Fax:  [phone]  Physical Therapy Treatment  Patient Details  Name: Brent Ho MRN: [id number] Date of Birth: [birth year] Referring Provider (PT): Danae Orleans, PA-C   Encounter Date: 12/30/2019   PT End of Session - 12/30/19 1532    Visit Number 8    Number of Visits 16    Date for PT Re-Evaluation 01/22/20    Authorization Type UHC Medicare    PT Start Time 1527    PT Stop Time 1625    PT Time Calculation (min) 58 min    Activity Tolerance Patient tolerated treatment well;Patient limited by pain    Behavior During Therapy [facility] for tasks assessed/performed           Past Medical History:  Diagnosis Date  . Anxiety   . Arthritis   . Hypercholesteremia   . Hypertension   . Pneumonia    hx of     Past Surgical History:  Procedure Laterality Date  . BUNIONECTOMY    . 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   Subjective Assessment - 12/30/19 1531    Subjective [name] doing ok.    Pertinent History R TKA  - 11/25/19    Patient Stated Goals "to be able to go back to work in 6 weeks"    Currently in Pain? No/denies    Pain Score 0-No pain    Pain Location Knee    Multiple Pain Sites No                             [facility] Adult PT Treatment/Exercise - 12/30/19 0001      Ambulation/Gait   Ambulation/Gait Yes    Ambulation/Gait Assistance 5: Supervision    Ambulation/Gait Assistance Details cues for increased B arm swing and B heel strike without AD    Ambulation Distance (Feet) 180 Feet    Assistive device None      Self-Care   Self-Care Other Self-Care Comments    Other Self-Care Comments  Educated pt. on proper technique  with scar massage with unscented lotion; Discussed pt. desire to start driving himself and encouraged pt. to reach out to doctor to confirm he can drive; pt. is no longer using heavy pain medications       Knee/Hip Exercises: Stretches   Sports administrator Right;1 rep;30 seconds    Quad Stretch Limitations prone quad stretch     Hip Flexor Stretch Right;1 rep;30 seconds    Hip Flexor Stretch Limitations mod thomas position       Knee/Hip Exercises: Aerobic   Recumbent Bike L1 x 6 min      Knee/Hip Exercises: Standing   Knee Flexion Right;Left;10 reps;Strengthening    Knee Flexion Limitations 3# - counter support     Hip Flexion Right;Left;10 reps;Knee bent    Hip Flexion Limitations 3# - alternating toe clears to 9" stool     Wall Squat 3 seconds   x 12 reps    Wall Squat Limitations leaning on green p-ball     SLS B SLS 3 x 10 sec      Knee/Hip Exercises: Seated   Long [employer]  Right;10 reps;Weights;Strengthening    Long Arc Quad Weight 3 lbs.    Long [employer] Limitations cues for 3" TKE hold       Knee/Hip Exercises: Supine   Straight Leg Raises Right;10 reps    Straight Leg Raises Limitations 3#       Knee/Hip Exercises: Sidelying   Hip ABduction Right;10 reps;Strengthening    Hip ABduction Limitations fatigued       Manual Therapy   Manual Therapy Soft tissue mobilization    Soft tissue mobilization R scar massage with free-up lotion to entire scar as it is well healed                   PT Education - 12/30/19 1633    Education Details HEP update; step-down, prone quad, mod thomas hip flexor stretch    Person(s) Educated Patient    Methods Explanation;Demonstration;Verbal cues;Handout    Comprehension Verbalized understanding;Returned demonstration;Verbal cues required            PT Short Term Goals - 12/18/19 1525      PT SHORT TERM GOAL #1   Title Patient will be independent with initial HEP    Status Achieved   12/16/19     PT SHORT TERM GOAL #2   Title  Patient will demonstrate R knee AROM >/= 5-90 dg to increase ease of mobility    Status Partially Met   12/16/19 - R knee AROM 7-110 dg   Target Date 12/18/19      PT SHORT TERM GOAL #3   Title Patient will ambulate with good gait pattern with SPC or LRAD    Status Achieved   12/18/19            PT Long Term Goals - 12/18/19 1526      PT LONG TERM GOAL #1   Title Patient will be independent with ongoing/advanced HEP    Status Partially Met    Target Date 01/22/20      PT LONG TERM GOAL #2   Title Patient will demonstrate R knee AROM >/= 2-120 dg to allow for normal gait and stair mechanics    Status On-going    Target Date 01/22/20      PT LONG TERM GOAL #3   Title Patient will demonstrate improved R LE strength to >/= 4+/5 for improved stability and ease of mobility    Status On-going    Target Date 01/22/20      PT LONG TERM GOAL #4   Title Patient will ambulate with normal gait pattern w/o AD    Status Partially Met    Target Date 01/22/20      PT LONG TERM GOAL #5   Title Patient will negotiate stairs reciprocally with normal step pattern w/o limitation due to R knee pain, LOM or weakness    Status Partially Met    Target Date 01/22/20                 Plan - 12/30/19 1533    Clinical Impression Statement Brent Ho doing well.  Reports he continues to walk around neighborhood daily.  Tolerated progression of quad/hip flexor stretching well thus HEP updated accordingly along with addition of 4" eccentric step-down for improved stair navigation.  Brent has nearly met LTG #4 demonstrating much improved/normalized gait pattern without AD now only occasional reminders for increased B arm swing and heel strike.  Instructed pt. in scar massage with unscented lotion as pt.'s entire incision now well  healed.  Pt. verbalizing understanding of scar massage and with plans to perform daily.  Ended visit with pt. declining modalities.    Comorbidities h/o R foot bunionectomy, HTN, h/o  acute respiratory failure d/t COVID-19, sepsis, anxiety, depression, HOH with hearing aids    Rehab Potential Excellent    PT Frequency 2x / week    PT Duration 8 weeks    PT Treatment/Interventions ADLs/Self Care Home Management;Cryotherapy;Electrical Stimulation;Iontophoresis 63m/ml Dexamethasone;Moist Heat;DME Instruction;Gait training;Stair training;Functional mobility training;Therapeutic activities;Therapeutic exercise;Balance training;Neuromuscular re-education;Patient/family education;Manual techniques;Scar mobilization;Passive range of motion;Dry needling;Taping;Vasopneumatic Device;Joint Manipulations    PT Next Visit Plan R knee ROM; LE strengthening; gait training w/o AD; manual therapy and modalities PRN for pain, ROM and edema management    PT Home Exercise Plan 9/2 - seated HS & gastroc stretches added to hospital D/C HEP; 9/14 - standing gastroc stretch, TKE at wall, seated propped knee extension stretch; 9/21 - blue TB TKE; seated red TB HS curl & knee extension, standing red TB 4-way SLR; 10/5 - prone quad, hip flexor stretch, step-down    Consulted and Agree with Plan of Care Patient           Patient will benefit from skilled therapeutic intervention in order to improve the following deficits and impairments:  Abnormal gait, Decreased activity tolerance, Decreased balance, Decreased knowledge of precautions, Decreased knowledge of use of DME, Decreased mobility, Decreased range of motion, Decreased safety awareness, Decreased scar mobility, Decreased strength, Difficulty walking, Increased edema, Increased fascial restricitons, Increased muscle spasms, Impaired perceived functional ability, Impaired flexibility, Impaired sensation, Pain  Visit Diagnosis: Acute pain of right knee  Stiffness of right knee, not elsewhere classified  Muscle weakness (generalized)  Other abnormalities of gait and mobility  Difficulty in walking, not elsewhere classified     Problem  List Patient Active Problem List   Diagnosis Date Noted  . 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 [place]is ([place] 07/12/2018  . COVID-19 virus infection 07/12/2018  . [place] with [place] ([place] 07/12/2018  . Depression 07/12/2018  . Hypercholesteremia   . Hypertension   . Acute respiratory disease due to COVID-19 virus     MBess Harvest PTA 12/30/19 4:39 PM   [facility] [address] [place] [address]: [phone]  Fax:  [phone] Name: RBulmaro FeagansMRN: [id number] of Birth: [birth year]

## 2020-01-01 ENCOUNTER — Other Ambulatory Visit: Payer: Self-pay

## 2020-01-01 ENCOUNTER — Ambulatory Visit: Payer: Medicare Other | Admitting: Physical Therapy

## 2020-01-01 ENCOUNTER — Encounter: Payer: Self-pay | Admitting: Physical Therapy

## 2020-01-01 DIAGNOSIS — M6281 Muscle weakness (generalized): Secondary | ICD-10-CM

## 2020-01-01 DIAGNOSIS — M25561 Pain in right knee: Secondary | ICD-10-CM | POA: Diagnosis not present

## 2020-01-01 DIAGNOSIS — M25661 Stiffness of right knee, not elsewhere classified: Secondary | ICD-10-CM

## 2020-01-01 DIAGNOSIS — R262 Difficulty in walking, not elsewhere classified: Secondary | ICD-10-CM

## 2020-01-01 DIAGNOSIS — R2689 Other abnormalities of gait and mobility: Secondary | ICD-10-CM

## 2020-01-01 IMAGING — DX PORTABLE CHEST - 1 VIEW
1 series · 1 of 1 positions shown · non-contrast
Comparison: None.

CLINICAL DATA: Fever for 2 days.

EXAM:
PORTABLE CHEST 1 VIEW

[chest ap]
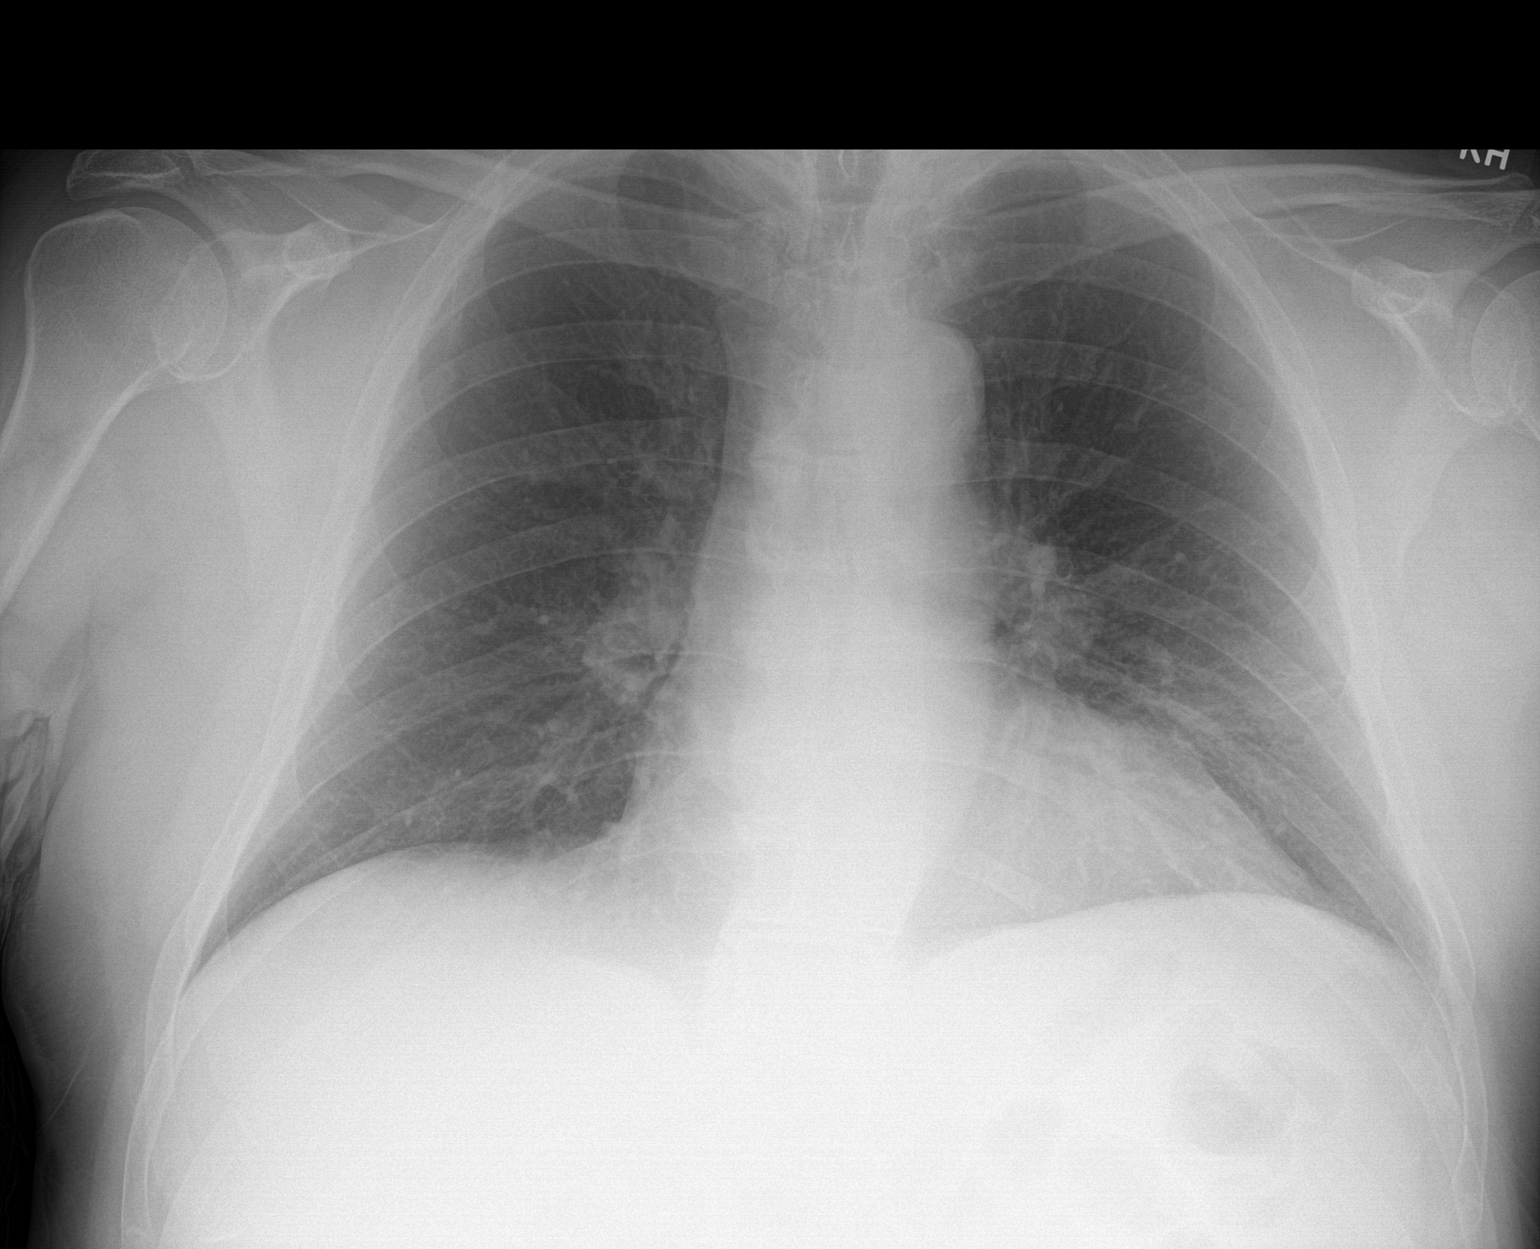

[1 of 1 positions shown; findings below may reference images not displayed]

FINDINGS: The cardiomediastinal contours are normal. Minimal patchy opacity in
the left mid lower lung zone. Pulmonary vasculature is normal. No
pleural effusion or pneumothorax. No acute osseous abnormalities are
seen.
IMPRESSION: Minimal patchy opacity in the left mid lower lung zone is suspicious
for pneumonia in the setting of fever, including atypical infection
and viral pneumonia.

## 2020-01-01 NOTE — Patient Instructions (Signed)
    Home exercise program created by Esaiah Wanless, PT.  For questions, please contact Mahaley Schwering via phone at [phone] or email at [email or web address]  [facility] [address]  [address] [place], [place], [address] Phone: [phone]   Fax:  [phone]    

## 2020-01-01 NOTE — Therapy (Signed)
[facility] [address]  [place], [place], [address] Phone: [phone]   Fax:  [phone]  Physical Therapy Treatment  Patient Details  Name: Brent Ho MRN: [id number] Date of Birth: 1945/07/14 Referring Provider (PT): Brent Orleans, PA-C   Encounter Date: 01/01/2020   PT End of Session - 01/01/20 1533    Visit Number 9    Number of Visits 16    Date for PT Re-Evaluation 01/22/20    Authorization Type U[employer] Medicare    PT Start Time 1533    PT Stop Time 1626    PT Time Calculation (min) 53 min    Activity Tolerance Patient tolerated treatment well;Patient limited by pain    Behavior During Therapy [name] for tasks assessed/performed           Past Medical History:  Diagnosis Date   Anxiety    Arthritis    Hypercholesteremia    Hypertension    Pneumonia    hx of     Past Surgical History:  Procedure Laterality Date   BUNIONECTOMY     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   Subjective Assessment - 01/01/20 1538    Subjective Pt noting increased ease getting started with revolutions on bike.    Pertinent History R TKA  - 11/25/19    Patient Stated Goals "to be able to go back to work in 6 weeks"    Currently in Pain? No/denies              [facility] PT Assessment - 01/01/20 1533      Assessment   Medical Diagnosis R TKA    Referring Provider (PT) Brent Orleans, PA-C    Onset Date/Surgical Date 11/25/19    Next MD Visit 01/08/20      AROM   Right Knee Extension 6    Right Knee Flexion 119                         [facility] Adult PT Treatment/Exercise - 01/01/20 1533      Exercises   Exercises Knee/Hip      Knee/Hip Exercises: Stretches   Passive Hamstring Stretch Right;30 seconds;2  reps    Passive Hamstring Stretch Limitations manual by PT - applying overpressure into knee extension      Knee/Hip Exercises: Aerobic   Recumbent Bike L2 x 6 min      Knee/Hip Exercises: Standing   Hip Flexion Right;10 reps;Stengthening;Knee straight    Hip Flexion Limitations green TB, cues for quad set to maintain knee extension; 1 pole A for balance    Hip ADduction Right;10 reps;Strengthening    Hip ADduction Limitations green TB, 1 pole A for balance    Hip Abduction Right;10 reps;Stengthening;Knee straight    Abduction Limitations green TB, 1 pole A for balance    Hip Extension Right;10 reps;Stengthening;Knee straight    Extension Limitations green TB, cues for quad set to maintain knee extension; 1 pole A for balance    Forward Step Up Right;10 reps;Step Height: 8";Hand Hold: 2    Forward Step Up Limitations + [place] TB TKE    Functional Squat 10 reps;5 seconds;2 sets    Functional Squat Limitations TRX, 2nd set + triple extension  Knee/Hip Exercises: Supine   Knee Extension Right;10 reps;AROM    Knee Extension Limitations pt performing quad set while PT providing longitudinal distraction along leg      Knee/Hip Exercises: Prone   Prone Knee Hang 3 minutes    Prone Knee Hang Weights (lbs) 3      Manual Therapy   Manual Therapy Joint mobilization;Soft tissue mobilization    Joint Mobilization R knee tibiofemoral anterior glide & superior patellar glide to promote knee extension ROM    Soft tissue mobilization STM/DTM to distal HS to promote increased knee extension                  PT Education - 01/01/20 1625    Education Details HEP update - prone hangs,  weight added to long-sitting knee extension stretch    Person(s) Educated Patient    Methods Explanation;Demonstration;Verbal cues;Handout    Comprehension Verbalized understanding;Verbal cues required;Returned demonstration            PT Short Term Goals - 01/01/20 1540      PT SHORT TERM GOAL #1     Title Patient will be independent with initial HEP    Status Achieved   12/16/19     PT SHORT TERM GOAL #2   Title Patient will demonstrate R knee AROM >/= 5-90 dg to increase ease of mobility    Status Partially Met   12/16/19 - R knee AROM 7-110 dg     PT SHORT TERM GOAL #3   Title Patient will ambulate with good gait pattern with SPC or LRAD    Status Achieved   12/18/19            PT Long Term Goals - 12/18/19 1526      PT LONG TERM GOAL #1   Title Patient will be independent with ongoing/advanced HEP    Status Partially Met    Target Date 01/22/20      PT LONG TERM GOAL #2   Title Patient will demonstrate R knee AROM >/= 2-120 dg to allow for normal gait and stair mechanics    Status On-going    Target Date 01/22/20      PT LONG TERM GOAL #3   Title Patient will demonstrate improved R LE strength to >/= 4+/5 for improved stability and ease of mobility    Status On-going    Target Date 01/22/20      PT LONG TERM GOAL #4   Title Patient will ambulate with normal gait pattern w/o AD    Status Partially Met    Target Date 01/22/20      PT LONG TERM GOAL #5   Title Patient will negotiate stairs reciprocally with normal step pattern w/o limitation due to R knee pain, LOM or weakness    Status Partially Met    Target Date 01/22/20                 Plan - 01/01/20 1542    Clinical Impression Statement Brent Ho reports that he feels that his walking is back to normal - symmetrical weight shift and step length noted but pt still lacking full knee extension on weight acceptance. He states HEP going well and was able to progress theraband resistance to green TB with standing 4-way SLR and [place]field band provided for TKE. Exercises emphasizing R knee extension with triple extension added to squats and TKE added to step-ups. Manual therapy also targeting knee extension ROM as pt continues to lack 6  of full extension. HEP progressed to add weight to knee extension prop and  introduce prone hangs to encourage full knee extension.    Comorbidities h/o R foot bunionectomy, HTN, h/o acute respiratory failure d/t COVID-19, sepsis, anxiety, depression, HOH with hearing aids    Rehab Potential Excellent    PT Frequency 2x / week    PT Duration 8 weeks    PT Treatment/Interventions ADLs/Self Care Home Management;Cryotherapy;Electrical Stimulation;Iontophoresis 73m/ml Dexamethasone;Moist Heat;DME Instruction;Gait training;Stair training;Functional mobility training;Therapeutic activities;Therapeutic exercise;Balance training;Neuromuscular re-education;Patient/family education;Manual techniques;Scar mobilization;Passive range of motion;Dry needling;Taping;Vasopneumatic Device;Joint Manipulations    PT Next Visit Plan MD progress note for 10/14 appt; R knee ROM - emphasizing knee extension; LE strengthening; manual therapy and modalities PRN for pain, ROM and edema management    PT Home Exercise Plan 9/2 - seated HS & gastroc stretches added to hospital D/C HEP; 9/14 - standing gastroc stretch, TKE at wall, seated propped knee extension stretch; 9/21 - blue TB TKE; seated red TB HS curl & knee extension, standing red TB 4-way SLR; 10/5 - prone quad, hip flexor stretch, step-down; 10/7 - prone hang    Consulted and Agree with Plan of Care Patient           Patient will benefit from skilled therapeutic intervention in order to improve the following deficits and impairments:  Abnormal gait, Decreased activity tolerance, Decreased balance, Decreased knowledge of precautions, Decreased knowledge of use of DME, Decreased mobility, Decreased range of motion, Decreased safety awareness, Decreased scar mobility, Decreased strength, Difficulty walking, Increased edema, Increased fascial restricitons, Increased muscle spasms, Impaired perceived functional ability, Impaired flexibility, Impaired sensation, Pain  Visit Diagnosis: Acute pain of right knee  Stiffness of right knee, not  elsewhere classified  Muscle weakness (generalized)  Other abnormalities of gait and mobility  Difficulty in walking, not elsewhere classified     Problem List Patient Active Problem List   Diagnosis Date Noted   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 [place] [address]Phone: [phone]  Fax:  [phone] Name: Brent MagowanMRN: [id number] of Birth: 603-02-1946

## 2020-01-06 ENCOUNTER — Other Ambulatory Visit: Payer: Self-pay

## 2020-01-06 ENCOUNTER — Ambulatory Visit: Payer: Medicare Other

## 2020-01-06 DIAGNOSIS — R2689 Other abnormalities of gait and mobility: Secondary | ICD-10-CM

## 2020-01-06 DIAGNOSIS — R262 Difficulty in walking, not elsewhere classified: Secondary | ICD-10-CM

## 2020-01-06 DIAGNOSIS — M6281 Muscle weakness (generalized): Secondary | ICD-10-CM

## 2020-01-06 DIAGNOSIS — M25561 Pain in right knee: Secondary | ICD-10-CM | POA: Diagnosis not present

## 2020-01-06 DIAGNOSIS — M25661 Stiffness of right knee, not elsewhere classified: Secondary | ICD-10-CM

## 2020-01-06 NOTE — Therapy (Addendum)
[facility] [address]  [place], [place], [address] Phone: [phone]   Fax:  [phone]  Physical Therapy Treatment / Progress Note  Patient Details  Name: Brent Ho MRN: [id number] Date of Birth: September 25, 1945 Referring Provider (PT): Danae Orleans, PA-C  Progress Note  Reporting Period 11/27/2019 to 01/06/2020  See note below for Objective Data and Assessment of Progress/Goals.      Encounter Date: 01/06/2020   PT End of Session - 01/06/20 1541    Visit Number 10    Number of Visits 16    Date for PT Re-Evaluation 01/22/20    Authorization Type [employer] Medicare    PT Start Time 9574    PT Stop Time 1613    PT Time Calculation (min) 43 min    Activity Tolerance Patient tolerated treatment well;Patient limited by pain    Behavior During Therapy WFL for tasks assessed/performed           Past Medical History:  Diagnosis Date  . Anxiety   . Arthritis   . Hypercholesteremia   . Hypertension   . Pneumonia    hx of     Past Surgical History:  Procedure Laterality Date  . BUNIONECTOMY    . 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   Subjective Assessment - 01/06/20 1804    Subjective Majour reports he has been performing his knee extension stretches at home.    Pertinent History R TKA  - 11/25/19    Patient Stated Goals "to be able to go back to work in 6 weeks"    Currently in Pain? No/denies    Pain Score 0-No pain              [facility] PT Assessment - 01/06/20 0001      Assessment   Medical Diagnosis R TKA    Referring Provider (PT) Danae Orleans, PA-C    Onset Date/Surgical Date 11/25/19    Hand Dominance Right    Next MD Visit 01/08/20      AROM   AROM Assessment Site Knee    Right/Left Knee Right     Right Knee Extension 6    Right Knee Flexion 120      PROM   PROM Assessment Site Knee    Right/Left Knee Right    Right Knee Extension 6    Right Knee Flexion 125      Strength   Strength Assessment Site Hip;Knee;Ankle    Right/Left Hip Right;Left    Right Hip Flexion 4+/5    Right Hip Extension 4+/5    Right Hip ABduction 4+/5    Right Hip ADduction 4+/5    Left Hip Flexion 4+/5    Left Hip Extension 4+/5    Left Hip ABduction 4+/5    Left Hip ADduction 4+/5    Right/Left Knee Right;Left    Right Knee Flexion 4+/5    Right Knee Extension 5/5    Left Knee Flexion 4+/5    Left Knee Extension 5/5    Right/Left Ankle Right;Left    Right Ankle Dorsiflexion 4+/5    Right Ankle Plantar Flexion 5/5    Left Ankle Dorsiflexion 4+/5    Left Ankle Plantar Flexion 5/5  [facility] Adult PT Treatment/Exercise - 01/06/20 0001      Knee/Hip Exercises: Stretches   Passive Hamstring Stretch Right;30 seconds;2 reps    Hip Flexor Stretch Right;1 rep;30 seconds    Hip Flexor Stretch Limitations mod thomas position       Knee/Hip Exercises: Aerobic   Recumbent Bike L2 x 6 min                    PT Short Term Goals - 01/01/20 1540      PT SHORT TERM GOAL #1   Title Patient will be independent with initial HEP    Status Achieved   12/16/19     PT SHORT TERM GOAL #2   Title Patient will demonstrate R knee AROM >/= 5-90 dg to increase ease of mobility    Status Partially Met   12/16/19 - R knee AROM 7-110 dg     PT SHORT TERM GOAL #3   Title Patient will ambulate with good gait pattern with SPC or LRAD    Status Achieved   12/18/19            PT Long Term Goals - 01/06/20 1546      PT LONG TERM GOAL #1   Title Patient will be independent with ongoing/advanced HEP    Status Partially Met      PT LONG TERM GOAL #2   Title Patient will demonstrate R knee AROM >/= 2-120 dg to allow for normal gait and stair mechanics    Status  Partially Met   10/12: met for flexion AROM     PT LONG TERM GOAL #3   Title Patient will demonstrate improved R LE strength to >/= 4+/5 for improved stability and ease of mobility    Status On-going      PT LONG TERM GOAL #4   Title Patient will ambulate with normal gait pattern w/o AD    Status Partially Met      PT LONG TERM GOAL #5   Title Patient will negotiate stairs reciprocally with normal step pattern w/o limitation due to R knee pain, LOM or weakness    Status Achieved   01/06/20                Plan - 01/06/20 1557    Clinical Impression Statement Shean has made good progress with physical therapy.  Reports consistent performance of comprehensive HEP and denies questions.  LTG #1 partially met.  Abe to meet LTG #2 for R knee AROM flexion demonstrating 6-120 dg.  R knee extension PROM/AROM at 6 dg with firm end feel and pain at end range.  Pt. aware of updated extension focused HEP for continued extension ROM progress including heel prop extension stretch, HS, GS stretching.  Pt. has met LTG #3 demonstrating at least 4+/5 strength with MMT in R LE muscle groups.  Pt. now ambulating without AD with nearly normal gait mechanics only with slight lack of R heel strike due to remaining extension ROM limitation.  LTG #4 partially met.  Pt. was able to ascend/descend stairs with one rail use without AD with normal step-pattern without R knee pain limiting.  LTG #5 met.  Jerian will continue to benefit from further skilled therapy to maximize functional strength and ROM for improved ease of mobility.     Comorbidities h/o R foot bunionectomy, HTN, h/o acute respiratory failure d/t COVID-19, sepsis, anxiety, depression, HOH with hearing aids    Rehab Potential Excellent  PT Frequency 2x / week    PT Duration 8 weeks    PT Treatment/Interventions ADLs/Self Care Home Management;Cryotherapy;Electrical Stimulation;Iontophoresis 24m/ml Dexamethasone;Moist Heat;DME Instruction;Gait  training;Stair training;Functional mobility training;Therapeutic activities;Therapeutic exercise;Balance training;Neuromuscular re-education;Patient/family education;Manual techniques;Scar mobilization;Passive range of motion;Dry needling;Taping;Vasopneumatic Device;Joint Manipulations    PT Next Visit Plan R knee ROM - emphasizing knee extension; LE strengthening; manual therapy and modalities PRN for pain, ROM and edema management    PT Home Exercise Plan 9/2 - seated HS & gastroc stretches added to hospital D/C HEP; 9/14 - standing gastroc stretch, TKE at wall, seated propped knee extension stretch; 9/21 - blue TB TKE; seated red TB HS curl & knee extension, standing red TB 4-way SLR; 10/5 - prone quad, hip flexor stretch, step-down; 10/7 - prone hang    Consulted and Agree with Plan of Care Patient           Patient will benefit from skilled therapeutic intervention in order to improve the following deficits and impairments:  Abnormal gait, Decreased activity tolerance, Decreased balance, Decreased knowledge of precautions, Decreased knowledge of use of DME, Decreased mobility, Decreased range of motion, Decreased safety awareness, Decreased scar mobility, Decreased strength, Difficulty walking, Increased edema, Increased fascial restricitons, Increased muscle spasms, Impaired perceived functional ability, Impaired flexibility, Impaired sensation, Pain  Visit Diagnosis: Acute pain of right knee  Stiffness of right knee, not elsewhere classified  Muscle weakness (generalized)  Other abnormalities of gait and mobility  Difficulty in walking, not elsewhere classified     Problem List Patient Active Problem List   Diagnosis Date Noted  . 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 Sepsis ([facility] 07/12/2018  . COVID-19 virus infection 07/12/2018  . Acute respiratory failure with hypoxia ([facility] 07/12/2018  . Depression 07/12/2018  . Hypercholesteremia   . Hypertension   . Acute respiratory disease due  to COVID-19 virus     MBess Harvest PTA 01/06/20 6:06 PM   [facility] [address] [place] [address]Phone: [phone]  Fax:  [phone] Name: RClara SmolenMRN: [id number] of Birth: [birth year]   RJakeis progressing well toward goals with only main deficit still remaining being limited R knee extension ROM. Anticipate he will be able to transition to HEP within next few visits if R knee extension ROM continues to improve.   JPercival Spanish PT, MPT 01/06/20, 6:10 PM  [facility] [place] [address]Phone: [phone]  Fax:  [phone]

## 2020-01-08 ENCOUNTER — Other Ambulatory Visit: Payer: Self-pay

## 2020-01-08 ENCOUNTER — Encounter: Payer: Self-pay | Admitting: Physical Therapy

## 2020-01-08 ENCOUNTER — Ambulatory Visit: Payer: Medicare Other | Admitting: Physical Therapy

## 2020-01-08 DIAGNOSIS — M25561 Pain in right knee: Secondary | ICD-10-CM

## 2020-01-08 DIAGNOSIS — R2689 Other abnormalities of gait and mobility: Secondary | ICD-10-CM

## 2020-01-08 DIAGNOSIS — R262 Difficulty in walking, not elsewhere classified: Secondary | ICD-10-CM

## 2020-01-08 DIAGNOSIS — M25661 Stiffness of right knee, not elsewhere classified: Secondary | ICD-10-CM

## 2020-01-08 DIAGNOSIS — M6281 Muscle weakness (generalized): Secondary | ICD-10-CM

## 2020-01-08 IMAGING — DX PORTABLE CHEST - 1 VIEW
1 series · 1 of 1 positions shown · non-contrast
Comparison: 07/05/2018

CLINICAL DATA: Cough, fever

EXAM:
PORTABLE CHEST 1 VIEW

[chest ap]
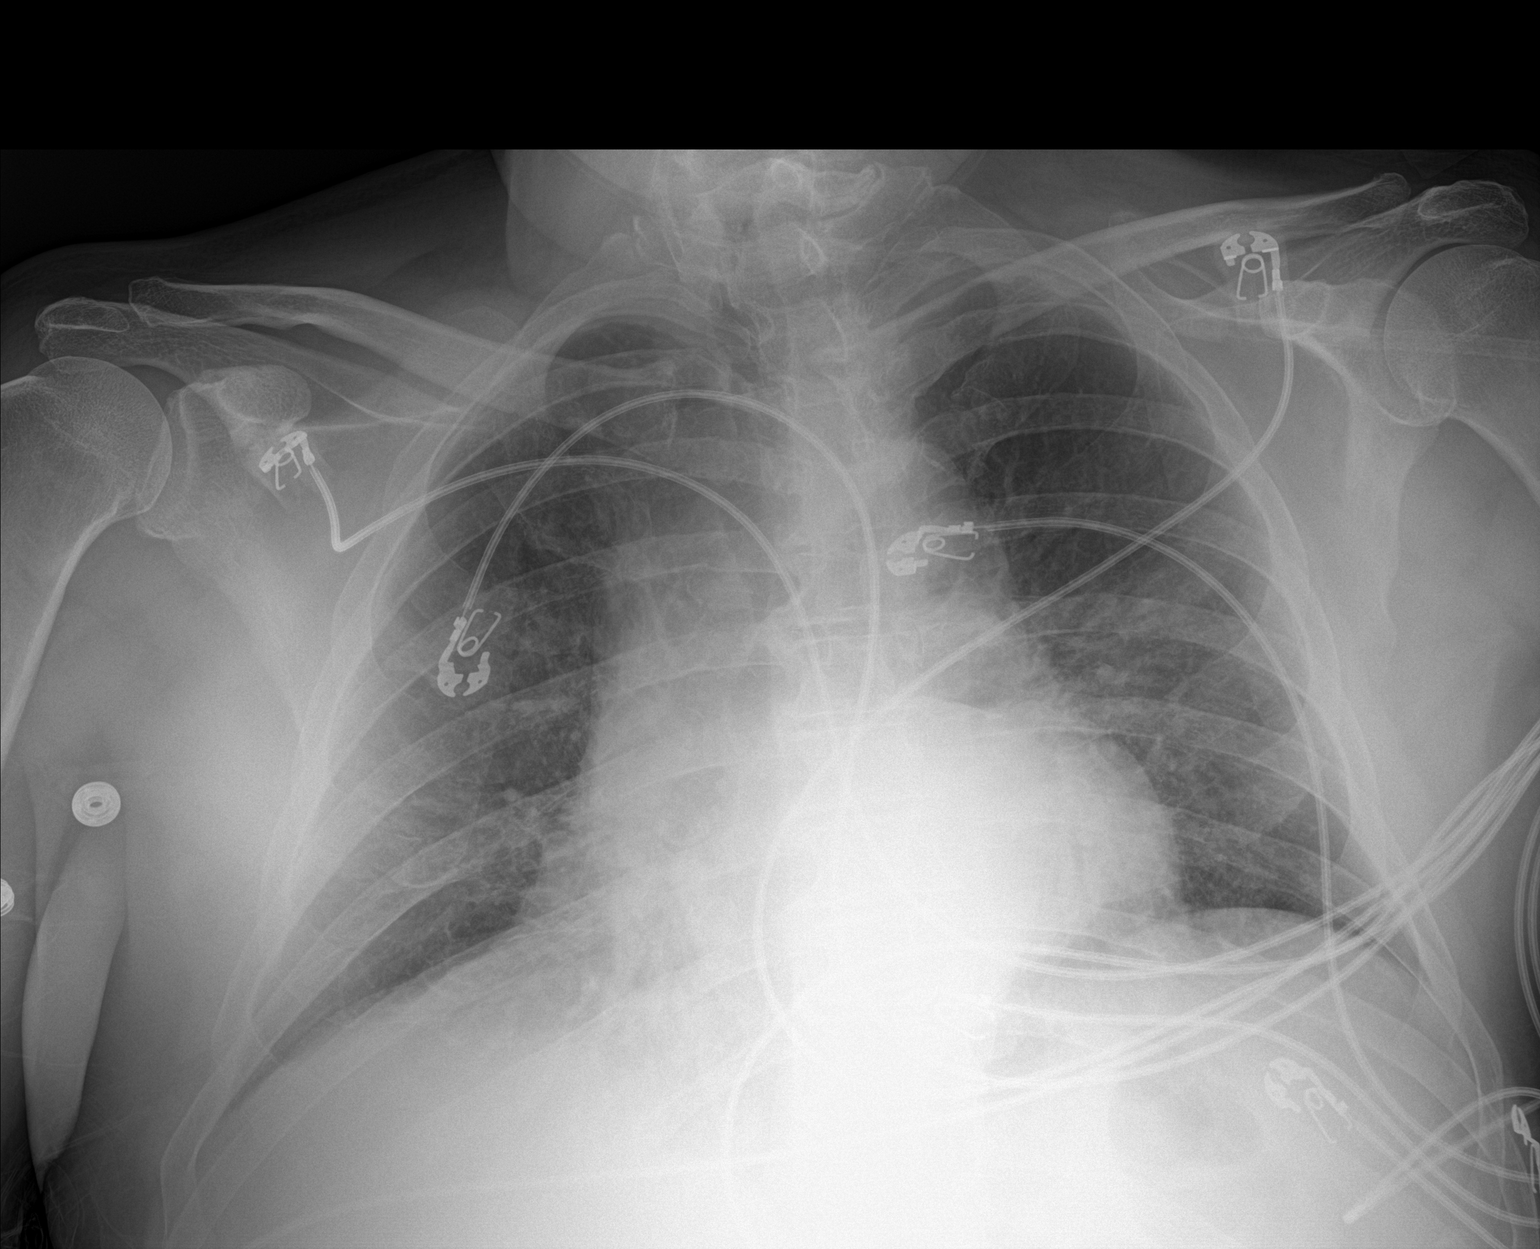

[1 of 1 positions shown; findings below may reference images not displayed]

FINDINGS: The heart size and mediastinal contours are within normal limits.
Both lungs are clear. The visualized skeletal structures are
unremarkable.
IMPRESSION: No active disease.

## 2020-01-08 NOTE — Therapy (Addendum)
[facility] [address]  [place], [place], [address] Phone: [phone]   Fax:  [phone]  Physical Therapy Treatment / Progress Note / Discharge Summary  Patient Details  Name: Brent Ho MRN: [id number] Date of Birth: [birth year] Referring Provider (PT): Danae Orleans, PA-C  Progress Note  Reporting Period 11/27/2019 to 01/08/2020  See note below for Objective Data and Assessment of Progress/Goals.      Encounter Date: 01/08/2020   PT End of Session - 01/08/20 1528    Visit Number 11    Number of Visits 16    Date for PT Re-Evaluation 01/22/20    Authorization Type [employer] Medicare    PT Start Time 1528    PT Stop Time 1615    PT Time Calculation (min) 47 min    Activity Tolerance Patient tolerated treatment well    Behavior During Therapy WFL for tasks assessed/performed           Past Medical History:  Diagnosis Date  . Anxiety   . Arthritis   . Hypercholesteremia   . Hypertension   . Pneumonia    hx of     Past Surgical History:  Procedure Laterality Date  . BUNIONECTOMY    . 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   Subjective Assessment - 01/08/20 1534    Subjective Pt reports MD wants to f/u with him again in 6 weeks but left it up to him as for continued PT.    Pertinent History R TKA  - 11/25/19    Patient Stated Goals "to be able to go back to work in 6 weeks"              [facility] PT Assessment - 01/08/20 1528      Assessment   Medical Diagnosis R TKA    Referring Provider (PT) Danae Orleans, PA-C    Onset Date/Surgical Date 11/25/19    Next MD Visit 02/18/20      Observation/Other Assessments   Focus on Therapeutic Outcomes (FOTO)  Knee - 67% (33% limitation)      AROM   Right Knee  Extension 6    Right Knee Flexion 121      PROM   Right Knee Extension 4    Right Knee Flexion 125      Strength   Right Hip Flexion 4+/5    Right Hip Extension 4+/5    Right Hip ABduction 4+/5    Right Hip ADduction 4+/5    Left Hip Flexion 4+/5    Left Hip Extension 4+/5    Left Hip ABduction 4+/5    Left Hip ADduction 4+/5    Right Knee Flexion 4+/5    Right Knee Extension 5/5    Left Knee Flexion 4+/5    Left Knee Extension 5/5    Right Ankle Dorsiflexion 4+/5    Right Ankle Plantar Flexion 5/5    Left Ankle Dorsiflexion 4+/5    Left Ankle Plantar Flexion 5/5                         [facility] Adult PT Treatment/Exercise - 01/08/20 1528      Ambulation/Gait   Ambulation/Gait Assistance 7: Independent    Ambulation/Gait Assistance Details  cues for R heel strike to promote knee extension    Ambulation Distance (Feet) 400 Feet    Assistive device None    Gait Pattern Within Functional Limits;Step-through pattern    Stairs Yes    Stair Management Technique No rails;Alternating pattern;Forwards    Number of Stairs 14    Height of Stairs 7      Exercises   Exercises Knee/Hip      Knee/Hip Exercises: Aerobic   Recumbent Bike L2 x 6 min      Knee/Hip Exercises: Standing   Terminal Knee Extension Right;20 reps;Strengthening;Theraband   5" hold   Theraband Level (Terminal Knee Extension) --   Nodal TB   Terminal Knee Extension Limitations cues for quad & glute set pushing heel down into floor & curling toes up to maximize knee extension    Functional Squat 15 reps;5 seconds    Functional Squat Limitations counter squat + triple extension                  PT Education - 01/08/20 1615    Education Details Final HEP review/update    Person(s) Educated Patient    Methods Explanation;Demonstration;Handout    Comprehension Verbalized understanding;Returned demonstration            PT Short Term Goals - 01/08/20 1537      PT SHORT TERM GOAL #1    Title Patient will be independent with initial HEP    Status Achieved   12/16/19     PT SHORT TERM GOAL #2   Title Patient will demonstrate R knee AROM >/= 5-90 dg to increase ease of mobility    Status Partially Met   01/08/20 - R knee AROM 6-121 dg; PROM 4-125 dg     PT SHORT TERM GOAL #3   Title Patient will ambulate with good gait pattern with SPC or LRAD    Status Achieved   12/18/19            PT Long Term Goals - 01/08/20 1601      PT LONG TERM GOAL #1   Title Patient will be independent with ongoing/advanced HEP    Status Achieved   01/08/20     PT LONG TERM GOAL #2   Title Patient will demonstrate R knee AROM >/= 2-120 dg to allow for normal gait and stair mechanics    Status Partially Met   01/08/20 - R knee AROM 6-121 dg     PT LONG TERM GOAL #3   Title Patient will demonstrate improved R LE strength to >/= 4+/5 for improved stability and ease of mobility    Status Achieved   01/06/20     PT LONG TERM GOAL #4   Title Patient will ambulate with normal gait pattern w/o AD    Status Achieved   01/08/20     PT LONG TERM GOAL #5   Title Patient will negotiate stairs reciprocally with normal step pattern w/o limitation due to R knee pain, LOM or weakness    Status Achieved   01/06/20                Plan - 01/08/20 1615    Clinical Impression Statement Brent Ho reports MD pleased with his progress and feels that he is on the right track working on the extension ROM for the R knee - pt states MD left it up to him whether he felt like he needed to continue with PT. HEP reviewed emphasizing stretches and  exercises to further promote knee extension and pt states he feels like he can keep up with these on his own. Also reviewed normal gait pattern with emphasis on heel strike to promote increased knee extension and improve heel-toe progression with pt able to perform good return demonstration with improved heel strike after review. All goals met except ROM goals only  partially met due to limited knee extension ROM. Brent Ho feels confident with transitioning to the HEP at this time but would like to remain on hold for 30 days in the event that issues arise with his HEP or that he feels the knee extension ROM is not continuing to improve.    Comorbidities h/o R foot bunionectomy, HTN, h/o acute respiratory failure d/t COVID-19, sepsis, anxiety, depression, HOH with hearing aids    Rehab Potential Excellent    PT Treatment/Interventions ADLs/Self Care Home Management;Cryotherapy;Electrical Stimulation;Iontophoresis 60m/ml Dexamethasone;Moist Heat;DME Instruction;Gait training;Stair training;Functional mobility training;Therapeutic activities;Therapeutic exercise;Balance training;Neuromuscular re-education;Patient/family education;Manual techniques;Scar mobilization;Passive range of motion;Dry needling;Taping;Vasopneumatic Device;Joint Manipulations    PT Next Visit Plan 30-day hold    PT Home Exercise Plan 9/2 - seated HS & gastroc stretches added to hospital D/C HEP; 9/14 - standing gastroc stretch, TKE at wall, seated propped knee extension stretch; 9/21 - blue TB (progressed to Radle TB 10/14) TKE; seated red TB HS curl & knee extension, standing red TB 4-way SLR; 10/5 - prone quad, hip flexor stretch, step-down; 10/7 - prone hang; 10/14 - triple extension    Consulted and Agree with Plan of Care Patient           Patient will benefit from skilled therapeutic intervention in order to improve the following deficits and impairments:  Abnormal gait, Decreased activity tolerance, Decreased balance, Decreased knowledge of precautions, Decreased knowledge of use of DME, Decreased mobility, Decreased range of motion, Decreased safety awareness, Decreased scar mobility, Decreased strength, Difficulty walking, Increased edema, Increased fascial restricitons, Increased muscle spasms, Impaired perceived functional ability, Impaired flexibility, Impaired sensation, Pain  Visit  Diagnosis: Acute pain of right knee  Stiffness of right knee, not elsewhere classified  Muscle weakness (generalized)  Other abnormalities of gait and mobility  Difficulty in walking, not elsewhere classified     Problem List Patient Active Problem List   Diagnosis Date Noted  . 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 Sepsis ([facility] 07/12/2018  . COVID-19 virus infection 07/12/2018  . Acute respiratory failure with hypoxia ([facility] 07/12/2018  . Depression 07/12/2018  . Hypercholesteremia   . Hypertension   . Acute respiratory disease due to COVID-19 virus     JPercival Spanish PT, MPT 01/08/2020, 4:38 PM  [facility] [place] [address]Phone: [phone]  Fax:  [phone] Name: Brent KlusMRN: [id number] of Birth: 6Jun 08, 1947 PHYSICAL THERAPY DISCHARGE SUMMARY  Visits from Start of Care: 11  Current functional level related to goals / functional outcomes:   Refer to above clinical impression for status as of last visit on 01/08/2020. Patient was placed on hold for 30 days and has not needed to return to PT, therefore will proceed with discharge from PT for this episode.   Remaining deficits:   As above.    Education / Equipment:   HEP  Plan: Patient agrees to discharge.  Patient goals were mostly  met. Patient is being discharged due to being pleased with the current functional level.  ?????     JPercival Spanish  PT, MPT 02/27/20, 9:49 AM  [facility] [address]  [place], [place], [address] Phone: [phone]   Fax:  [phone]

## 2020-01-15 ENCOUNTER — Encounter: Payer: Medicare Other | Admitting: Physical Therapy

## 2020-01-22 ENCOUNTER — Encounter: Payer: Medicare Other | Admitting: Physical Therapy

## 2022-11-13 NOTE — Therapy (Signed)
OUTPATIENT PHYSICAL THERAPY CERVICAL EVALUATION   Patient Name: Brent Ho MRN: [id number] DOB:1945/11/01, 77 y.o., male Today's Date: 11/20/2022   END OF SESSION:  PT End of Session - 11/20/22 0840     Visit Number 1    Date for PT Re-Evaluation 01/01/23    Authorization Type [employer]    Progress Note Due on Visit 10    PT Start Time 0840    PT Stop Time 0934    PT Time Calculation (min) 54 min    Activity Tolerance Patient tolerated treatment well    Behavior During Therapy [facility] for tasks assessed/performed             Past Medical History:  Diagnosis Date   Anxiety    Arthritis    Hypercholesteremia    Hypertension    Pneumonia    hx of    Past Surgical History:  Procedure Laterality Date   BUNIONECTOMY     TOTAL KNEE ARTHROPLASTY Right 11/25/2019   Procedure: TOTAL KNEE ARTHROPLASTY;  Surgeon: Durene Romans, MD;  Location: [facility];  Service: Orthopedics;  Laterality: Right;  70 mins   Patient Active Problem List   Diagnosis Date Noted   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   Sepsis (H[facility]) 07/12/2018   COVID-19 virus infection 07/12/2018   Acute respiratory failure with hypoxia (HCC) 07/12/2018   Depression 07/12/2018   Hypercholesteremia    Hypertension    Acute respiratory disease due to COVID-19 virus     PCP: Angelica Chessman, MD   REFERRING PROVIDER: Angelica Chessman, MD   REFERRING DIAG: [phone] (ICD-10-CM) - Neck muscle spasm   THERAPY DIAG:  Cervicalgia  Radiculopathy, cervical region  Other muscle spasm  Muscle weakness (generalized)  Abnormal posture  RATIONALE FOR EVALUATION AND TREATMENT: Rehabilitation  ONSET DATE: >2 months  NEXT MD VISIT: PRN   SUBJECTIVE:                                                                                                                                                                                                         SUBJECTIVE STATEMENT: Pt reports >2 months ago he was watching TV with his  head rotated and tilted looking up and to the L for an extended period - the next day he awoke with stiff and painful L neck and upper shoulder. He has since noted some intermittent numbness and tingling in forearm and hand, most common while driving.   Hand dominance: Right  PAIN: Are you having pain? Yes: NPRS scale: turning to L 2-3/10 Pain location:  L  lateral neck & upper shoulder Pain description: ache, intermittent numbness and tingling in L forearm and hand Aggravating factors: turning head to L, sleeping position (esp on L side) Relieving factors: positional changes  PERTINENT HISTORY:  R TKA 11/25/19, OA, HTN, adjustment disorder with anxiety, panic anxiety disorder, hearing loss  PRECAUTIONS: None  RED FLAGS: None  HAND DOMINANCE: Right  WEIGHT BEARING RESTRICTIONS: No  FALLS:  Has patient fallen in last 6 months? 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  PATIENT SURVEYS:  NDI 7 / 50 = 14.0 %  COGNITION: Overall cognitive status: Within functional limits for tasks assessed  SENSATION: WFL Intermittent L forearm & hand numbness and tingling  POSTURE:  rounded shoulders, forward head, and L shoulder elevated relative to R  PALPATION: Increased muscle tension t/o L neck and upper shoulder with TTP in L UT   CERVICAL ROM:   Active ROM  Eval  Flexion 45 ^^  Extension 40 ^  Right lateral flexion 18 ^  Left lateral flexion 20 ^  Right rotation 48 ^  Left rotation 50 ^^   (Blank rows = not tested, ^ = increased pain/pulling)  UPPER EXTREMITY ROM:  Active ROM Right eval Left eval  Shoulder flexion WNL WNL  Shoulder extension WNL WNL  Shoulder abduction WNL WNL ^  Shoulder adduction    Shoulder internal rotation FIR WNL FIR WNL  Shoulder external rotation FER T4 FER T3  Elbow flexion    Elbow extension    Wrist flexion    Wrist extension    Wrist ulnar deviation    Wrist radial deviation    Wrist pronation    Wrist supination     (Blank rows = not tested)  UPPER EXTREMITY MMT:  MMT Right eval Left eval  Shoulder flexion 4+ 4  Shoulder extension 4+ 4  Shoulder abduction 4+ 4  Shoulder adduction    Shoulder internal rotation 5 4+  Shoulder external rotation 4+ 4  Middle trapezius    Lower trapezius    Elbow flexion    Elbow extension    Wrist flexion    Wrist extension    Wrist ulnar deviation    Wrist radial deviation    Wrist pronation    Wrist supination    Grip strength 65 51.67   (Blank rows = not tested)  CERVICAL SPECIAL TESTS:  Spurling's test: Positive and Distraction test: Negative    TODAY'S TREATMENT:   11/20/22 - Eval THERAPEUTIC EXERCISE: to improve flexibility, strength and mobility.  Demonstration, verbal and tactile cues throughout for technique.  Supine cervical retraction 10 x 5" Seated gentle L UT stretch x 30" Seated gentle L LS stretch x 30" Seated scap retraction and depression 10 x 5"   PATIENT EDUCATION:  Education details: PT eval findings, anticipated POC, initial HEP, and role of DN  Person educated: Patient Education method: Explanation,  Demonstration, Verbal cues, and Handouts Education comprehension: verbalized understanding, returned demonstration, verbal cues required, and needs further education  HOME EXERCISE PROGRAM: Access Code: [facility] URL:  [email or web address] Date: 11/20/2022 Prepared by: Glenetta Hew  Exercises - Supine Cervical Retraction with Towel  - 2 x daily - 7 x weekly - 2 sets - 10 reps - 3-5 sec hold - Seated Gentle Upper Trapezius Stretch (Mirrored)  - 2 x daily - 7 x weekly - 3 reps - 30 sec hold - Gentle Levator Scapulae Stretch  - 2 x daily - 7 x weekly - 3 reps - 30 sec hold - Seated Scapular Retraction  - 2 x daily - 7 x weekly - 2 sets - 10 reps - 5 sec hold  Patient Education - Trigger Point Dry Needling   ASSESSMENT:  CLINICAL IMPRESSION: Brent Ho is a 77 y.o. male who was seen today for physical therapy evaluation and treatment for L-sided neck and upper shoulder pain/muscle spasm.  Pain originated ~2 months ago after a sustained period of having head and turned left and tilted upward while watching TV, however has persisted with new onset of intermittent L UE numbness and tingling.  Current deficits include abnormal posture, limited and painful cervical ROM, abnormal muscle tension in L neck and upper shoulder musculature with TP in UT, and mild L>R UE weakness.  Pain limits him while driving as well as interferes with ability to find comfortable position while sleeping.  Brent Ho will benefit from skilled PT to address above deficits to improve mobility and activity tolerance with decreased pain interference.   OBJECTIVE IMPAIRMENTS: decreased activity tolerance, decreased knowledge of condition, decreased mobility, decreased ROM, decreased strength, hypomobility, increased fascial restrictions, impaired perceived functional ability, increased muscle spasms, impaired flexibility, impaired sensation, impaired UE functional use, improper body mechanics, postural dysfunction, and pain.   ACTIVITY LIMITATIONS: carrying, lifting, sleeping, and reach over head  PARTICIPATION LIMITATIONS: driving, occupation, and yard work  PERSONAL FACTORS: Fitness, Past/current experiences, Time since onset of  injury/illness/exacerbation, and 3+ comorbidities: R TKA 11/25/19, OA, HTN, adjustment disorder with anxiety, panic anxiety disorder, hearing loss  are also affecting patient's functional outcome.   REHAB POTENTIAL: Excellent  CLINICAL DECISION MAKING: Stable/uncomplicated  EVALUATION COMPLEXITY: Low   GOALS: Goals reviewed with patient? Yes  SHORT TERM GOALS: Target date: 12/11/2022  Patient will be independent with initial HEP to improve outcomes and carryover.  Baseline: Initial HEP on eval Goal status: INITIAL  2.  Patient will report centralization of L UE numbness and tingling. Baseline: Intermittent numbness and tingling in L forearm and hand Goal status: INITIAL  LONG TERM GOALS: Target date: 01/01/2023   Patient will be independent with ongoing/advanced HEP for self-management at home.  Baseline:  Goal status: INITIAL  2.  Patient will demonstrate improved posture to decrease muscle imbalance. Baseline: Forward head and rounded shoulder posture with L shoulder elevated Goal status: INITIAL  3.  Patient will report 50-75% improvement in neck pain to improve QOL.  Baseline: 2-3/10 Goal status: INITIAL  4.  Patient to report 50-75% reduction in frequency and intensity of L UE numbness and tingling.   Baseline: Intermittent numbness and tingling in L forearm and hand Goal status: INITIAL   5.  Patient will demonstrate functional pain free cervical ROM for safety with driving.  Baseline: Refer to above cervical ROM table Goal status: INITIAL  6.  Patient will report </= 8% on NDI to demonstrate improved functional ability.  Baseline: 7 / 50 = 14.0 %  Goal status: INITIAL  7.  Patient will ability to sleep in preferred position without limitation due to pain or L L UE numbness and tingling. Baseline: Unable to sleep on L side Goal status: INITIAL     PLAN:  PT FREQUENCY: 1-2x/week - starting 2x/wk, potentially tapering to 1x/wk  PT DURATION: 6 weeks  PLANNED  INTERVENTIONS: Therapeutic exercises, Therapeutic activity, Neuromuscular re-education, Patient/Family education, Self Care, Joint mobilization, Dry Needling, Electrical stimulation, Spinal manipulation, Spinal mobilization, Taping, Traction, Ultrasound, Manual therapy, and Re-evaluation  PLAN FOR NEXT SESSION: Review initial HEP, gently progress cervical ROM/stretching and postural strengthening; MT +/- DN to address abnormal muscle tension in L UT, LS and cervical paraspinals; modalities as needed for pain control   Marry Guan, PT 11/20/2022, 12:59 PM

## 2022-11-20 ENCOUNTER — Ambulatory Visit: Payer: Medicare HMO | Attending: Family Medicine | Admitting: Physical Therapy

## 2022-11-20 ENCOUNTER — Other Ambulatory Visit: Payer: Self-pay

## 2022-11-20 ENCOUNTER — Encounter: Payer: Self-pay | Admitting: Physical Therapy

## 2022-11-20 DIAGNOSIS — M542 Cervicalgia: Secondary | ICD-10-CM

## 2022-11-20 DIAGNOSIS — M62838 Other muscle spasm: Secondary | ICD-10-CM | POA: Diagnosis present

## 2022-11-20 DIAGNOSIS — R293 Abnormal posture: Secondary | ICD-10-CM | POA: Diagnosis present

## 2022-11-20 DIAGNOSIS — M6281 Muscle weakness (generalized): Secondary | ICD-10-CM | POA: Diagnosis present

## 2022-11-20 DIAGNOSIS — M5412 Radiculopathy, cervical region: Secondary | ICD-10-CM

## 2022-11-24 ENCOUNTER — Encounter: Payer: Self-pay | Admitting: Physical Therapy

## 2022-11-24 ENCOUNTER — Ambulatory Visit: Payer: Medicare HMO | Admitting: Physical Therapy

## 2022-11-24 DIAGNOSIS — M542 Cervicalgia: Secondary | ICD-10-CM | POA: Diagnosis not present

## 2022-11-24 DIAGNOSIS — M5412 Radiculopathy, cervical region: Secondary | ICD-10-CM

## 2022-11-24 DIAGNOSIS — M6281 Muscle weakness (generalized): Secondary | ICD-10-CM

## 2022-11-24 DIAGNOSIS — M62838 Other muscle spasm: Secondary | ICD-10-CM

## 2022-11-24 DIAGNOSIS — R293 Abnormal posture: Secondary | ICD-10-CM

## 2022-11-24 NOTE — Therapy (Signed)
OUTPATIENT PHYSICAL THERAPY TREATMENT   Patient Name: Brent Ho MRN: [id number] DOB:02-19-1946, 77 y.o., male Today's Date: 11/24/2022   END OF SESSION:  PT End of Session - 11/24/22 0803     Visit Number 2    Date for PT Re-Evaluation 01/01/23    Authorization Type [employer]    Progress Note Due on Visit 10    PT Start Time 0803    PT Stop Time 0845    PT Time Calculation (min) 42 min    Activity Tolerance Patient tolerated treatment well    Behavior During Therapy [facility] for tasks assessed/performed             Past Medical History:  Diagnosis Date   Anxiety    Arthritis    Hypercholesteremia    Hypertension    Pneumonia    hx of    Past Surgical History:  Procedure Laterality Date   BUNIONECTOMY     TOTAL KNEE ARTHROPLASTY Right 11/25/2019   Procedure: TOTAL KNEE ARTHROPLASTY;  Surgeon: Durene Romans, MD;  Location: [facility];  Service: Orthopedics;  Laterality: Right;  70 mins   Patient Active Problem List   Diagnosis Date Noted   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   Sepsis (HCC) 07/12/2018   COVID-19 virus infection 07/12/2018   Acute respiratory failure with hypoxia (HCC) 07/12/2018   Depression 07/12/2018   Hypercholesteremia    Hypertension    Acute respiratory disease due to COVID-19 virus     PCP: Angelica Chessman, MD   REFERRING PROVIDER: Angelica Chessman, MD   REFERRING DIAG: [phone] (ICD-10-CM) - Neck muscle spasm   THERAPY DIAG:  Cervicalgia  Radiculopathy, cervical region  Other muscle spasm  Muscle weakness (generalized)  Abnormal posture  RATIONALE FOR EVALUATION AND TREATMENT: Rehabilitation  ONSET DATE: >2 months  NEXT MD VISIT: PRN   SUBJECTIVE:                                                                                                                                                                                                         SUBJECTIVE STATEMENT: Pt reports he slept funny last night and had trouble going  back to sleep.   Hand dominance: Right  PAIN: Are you having pain? Yes: NPRS scale: turning to L 2-3/10 Pain location: L  lateral neck & upper shoulder Pain description: ache, intermittent numbness and tingling in L forearm and hand Aggravating factors: turning head to L, sleeping position (esp on L side) Relieving factors: positional changes  PERTINENT HISTORY:  R TKA 11/25/19, OA, HTN,  adjustment disorder with anxiety, panic anxiety disorder, hearing loss  PRECAUTIONS: None  RED FLAGS: None  HAND DOMINANCE: Right  WEIGHT BEARING RESTRICTIONS: No  FALLS:  Has patient fallen in last 6 months? 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  PATIENT SURVEYS:  NDI 7 / 50 = 14.0 %  COGNITION: Overall cognitive status: Within functional limits for tasks assessed  SENSATION: WFL Intermittent L forearm & hand numbness and tingling  POSTURE:  rounded shoulders, forward head, and L shoulder elevated relative to R  PALPATION: Increased muscle tension t/o L neck and upper shoulder with TTP in L UT   CERVICAL ROM:   Active ROM Eval  Flexion 45 ^^  Extension 40 ^  Right lateral flexion 18 ^  Left lateral flexion 20 ^  Right rotation 48 ^  Left rotation 50 ^^   (Blank rows = not tested, ^ = increased pain/pulling)  UPPER EXTREMITY ROM:  Active ROM  Right eval Left eval  Shoulder flexion WNL WNL  Shoulder extension WNL WNL  Shoulder abduction WNL WNL ^  Shoulder adduction    Shoulder internal rotation FIR WNL FIR WNL  Shoulder external rotation FER T4 FER T3  Elbow flexion    Elbow extension    Wrist flexion    Wrist extension    Wrist ulnar deviation    Wrist radial deviation    Wrist pronation    Wrist supination     (Blank rows = not tested)  UPPER EXTREMITY MMT:  MMT Right eval Left eval  Shoulder flexion 4+ 4  Shoulder extension 4+ 4  Shoulder abduction 4+ 4  Shoulder adduction    Shoulder internal rotation 5 4+  Shoulder external rotation 4+ 4  Middle trapezius    Lower trapezius    Elbow flexion    Elbow extension    Wrist flexion    Wrist extension    Wrist ulnar deviation    Wrist radial deviation    Wrist pronation    Wrist supination    Grip strength 65 51.67   (Blank rows = not tested)  CERVICAL SPECIAL TESTS:  Spurling's test: Positive and Distraction test: Negative    TODAY'S TREATMENT:   11/24/22  THERAPEUTIC EXERCISE: to improve flexibility, strength and mobility.  Demonstration, verbal and tactile cues throughout for technique.  UBE - L2.0 x 4 min (2' each fwd & back) Seated cervical retraction 10 x 5" Seated gentle L UT stretch 3 x 30" Seated gentle L LS stretch 3 x 30" Seated scap retraction/depression + GTB B shoulder ER 10 x 5" Standing scap retraction/depression + GTB B shoulder row 10 x 5" Standing scap retraction/depression + GTB B shoulder extension 10 x 5" Seated cervical rotation SNAGs 10 x 3" Seated cervical extension SNAG 10 x 3"  MANUAL THERAPY: To promote normalized muscle tension, improved flexibility, improved joint mobility, increased ROM, and reduced pain.  Skilled palpation and monitoring of soft tissue during DN Trigger Point Dry-Needling  Treatment instructions: Expect mild to moderate muscle soreness. S/S of pneumothorax if dry needled over a lung field, and to  seek immediate medical attention should they occur. Patient verbalized understanding of these instructions and education. Patient Consent Given: Yes Education handout provided: Previously provided Muscles treated: L UT, LS and cervical paraspinals Electrical stimulation performed: No Parameters: N/A Treatment response/outcome: Twitch Response Elicited and Palpable Increase in Muscle Length STM/DTM, manual TPR and pin & stretch to muscles addressed with DN   11/20/22 - Eval THERAPEUTIC EXERCISE: to improve flexibility, strength and mobility.  Demonstration, verbal and tactile cues throughout for technique.  Supine cervical retraction 10 x 5" Seated gentle L UT stretch x 30" Seated gentle L LS stretch x 30" Seated scap retraction and depression 10 x 5"   PATIENT EDUCATION:  Education details: role of DN and DN rational, procedure, outcomes, potential side effects, and recommended post-treatment exercises/activity  Person educated: Patient Education method: Explanation and Handouts Education comprehension: verbalized understanding  HOME EXERCISE PROGRAM: Access Code: [facility] URL: [email or web address] Date: 11/24/2022 Prepared by: Glenetta Hew  Exercises - Supine Cervical Retraction with Towel  - 2 x daily - 7 x weekly - 2 sets - 10 reps - 3-5 sec hold - Seated Gentle Upper Trapezius Stretch (Mirrored)  - 2 x daily - 7 x weekly - 3 reps - 30 sec hold - Gentle Levator Scapulae Stretch  - 2 x daily - 7 x weekly - 3 reps - 30 sec hold - Seated Scapular Retraction  - 2 x daily - 7 x weekly - 2 sets - 10 reps - 5 sec hold - Shoulder External Rotation and Scapular Retraction with Resistance  - 1 x daily - 7 x weekly - 2 sets - 10 reps - 3-5 sec hold - Standing Bilateral Low Shoulder Row with Anchored Resistance  - 1 x daily - 7 x weekly - 2 sets - 10 reps - 5 sec hold - Scapular Retraction with Resistance Advanced  - 1 x daily - 7 x weekly - 2 sets - 10 reps - 5 sec hold -  Seated Assisted Cervical Rotation with Towel  - 1 x daily - 7 x weekly - 2 sets - 10 reps - 3 sec hold - Mid-Lower Cervical Extension SNAG with Strap  - 1 x daily - 7 x weekly - 2 sets - 10 reps - 3 sec hold  Patient Education - Trigger Point Dry Needling   ASSESSMENT:  CLINICAL IMPRESSION: Adony admits to less frequent performance of HEP than prescribed but denies any issues, although he did note he tries to incorporate the shoulder blade squeezes while at work. Increased muscle tension and L UT TP still present which appeared amenable to DN. After explanation of DN rational, procedures, outcomes and potential side effects, including precautions with DN over the lung fields, patient verbalized consent to DN treatment in conjunction with manual STM/DTM and TPR to reduce ttp/muscle tension. Muscles treated as indicated above. DN produced normal response with good twitches elicited resulting in palpable reduction in pain/ttp and muscle tension. Pt educated to expect mild to moderate muscle soreness for up to 24-48 hrs and instructed to continue prescribed HEP and current activity level with pt verbalizing understanding of these instructions. Initial HEP reviewed with good return demonstration. Progressed postural/scapular strengthening with introduction of GTB resistance and introduced cervical SNAGs to facilitate increased cervical extension and rotation ROM. Pryce will benefit  from continue skilled PT to address ongoing muscle tension, ROM and strength deficits to improve mobility and activity tolerance with decreased pain interference.   OBJECTIVE IMPAIRMENTS: decreased activity tolerance, decreased knowledge of condition, decreased mobility, decreased ROM, decreased strength, hypomobility, increased fascial restrictions, impaired perceived functional ability, increased muscle spasms, impaired flexibility, impaired sensation, impaired UE functional use, improper body mechanics, postural dysfunction, and  pain.   ACTIVITY LIMITATIONS: carrying, lifting, sleeping, and reach over head  PARTICIPATION LIMITATIONS: driving, occupation, and yard work  PERSONAL FACTORS: Fitness, Past/current experiences, Time since onset of injury/illness/exacerbation, and 3+ comorbidities: R TKA 11/25/19, OA, HTN, adjustment disorder with anxiety, panic anxiety disorder, hearing loss  are also affecting patient's functional outcome.   REHAB POTENTIAL: Excellent  CLINICAL DECISION MAKING: Stable/uncomplicated  EVALUATION COMPLEXITY: Low   GOALS: Goals reviewed with patient? Yes  SHORT TERM GOALS: Target date: 12/11/2022  Patient will be independent with initial HEP to improve outcomes and carryover.  Baseline: Initial HEP on eval Goal status: IN PROGRESS  2.  Patient will report centralization of L UE numbness and tingling. Baseline: Intermittent numbness and tingling in L forearm and hand Goal status: IN PROGRESS  LONG TERM GOALS: Target date: 01/01/2023   Patient will be independent with ongoing/advanced HEP for self-management at home.  Baseline:  Goal status: IN PROGRESS  2.  Patient will demonstrate improved posture to decrease muscle imbalance. Baseline: Forward head and rounded shoulder posture with L shoulder elevated Goal status: IN PROGRESS  3.  Patient will report 50-75% improvement in neck pain to improve QOL.  Baseline: 2-3/10 Goal status: IN PROGRESS  4.  Patient to report 50-75% reduction in frequency and intensity of L UE numbness and tingling.   Baseline: Intermittent numbness and tingling in L forearm and hand Goal status: IN PROGRESS   5.  Patient will demonstrate functional pain free cervical ROM for safety with driving.  Baseline: Refer to above cervical ROM table Goal status: IN PROGRESS  6.  Patient will report </= 8% on NDI to demonstrate improved functional ability.  Baseline: 7 / 50 = 14.0 % Goal status: IN PROGRESS  7.  Patient will ability to sleep in preferred  position without limitation due to pain or L L UE numbness and tingling. Baseline: Unable to sleep on L side Goal status: IN PROGRESS     PLAN:  PT FREQUENCY: 1-2x/week - starting 2x/wk, potentially tapering to 1x/wk  PT DURATION: 6 weeks  PLANNED INTERVENTIONS: Therapeutic exercises, Therapeutic activity, Neuromuscular re-education, Patient/Family education, Self Care, Joint mobilization, Dry Needling, Electrical stimulation, Spinal manipulation, Spinal mobilization, Taping, Traction, Ultrasound, Manual therapy, and Re-evaluation  PLAN FOR NEXT SESSION: Assess response to DN; gently progress cervical ROM/stretching and postural strengthening - review/update HEP PRN; MT +/- DN to address abnormal muscle tension in L UT, LS and cervical paraspinals; modalities as needed for pain control   Marry Guan, PT 11/24/2022, 9:01 AM

## 2022-12-01 ENCOUNTER — Ambulatory Visit: Payer: Medicare HMO | Admitting: Physical Therapy

## 2022-12-04 ENCOUNTER — Ambulatory Visit: Payer: Medicare HMO | Attending: Family Medicine

## 2022-12-08 ENCOUNTER — Encounter: Payer: Self-pay | Admitting: Physical Therapy

## 2022-12-08 ENCOUNTER — Ambulatory Visit: Payer: Medicare HMO | Attending: Family Medicine | Admitting: Physical Therapy

## 2022-12-08 DIAGNOSIS — M62838 Other muscle spasm: Secondary | ICD-10-CM | POA: Diagnosis present

## 2022-12-08 DIAGNOSIS — R293 Abnormal posture: Secondary | ICD-10-CM | POA: Diagnosis present

## 2022-12-08 DIAGNOSIS — M6281 Muscle weakness (generalized): Secondary | ICD-10-CM | POA: Insufficient documentation

## 2022-12-08 DIAGNOSIS — M5412 Radiculopathy, cervical region: Secondary | ICD-10-CM | POA: Insufficient documentation

## 2022-12-08 DIAGNOSIS — M542 Cervicalgia: Secondary | ICD-10-CM | POA: Diagnosis present

## 2022-12-08 NOTE — Therapy (Signed)
OUTPATIENT PHYSICAL THERAPY TREATMENT   Patient Name: Brent Ho MRN: [id number] DOB:December 24, 1945, 77 y.o., male Today's Date: 12/08/2022   END OF SESSION:  PT End of Session - 12/08/22 0804     Visit Number 3    Date for PT Re-Evaluation 01/01/23    Authorization Type [employer]    Progress Note Due on Visit 10    PT Start Time 0804    PT Stop Time 0845    PT Time Calculation (min) 41 min    Activity Tolerance Patient tolerated treatment well    Behavior During Therapy [facility] for tasks assessed/performed             Past Medical History:  Diagnosis Date   Anxiety    Arthritis    Hypercholesteremia    Hypertension    Pneumonia    hx of    Past Surgical History:  Procedure Laterality Date   BUNIONECTOMY     TOTAL KNEE ARTHROPLASTY Right 11/25/2019   Procedure: TOTAL KNEE ARTHROPLASTY;  Surgeon: Durene Romans, MD;  Location: [facility];  Service: Orthopedics;  Laterality: Right;  70 mins   Patient Active Problem List   Diagnosis Date Noted   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   Sepsis (HCC) 07/12/2018   COVID-19 virus infection 07/12/2018   Acute respiratory failure with hypoxia (HCC) 07/12/2018   Depression 07/12/2018   Hypercholesteremia    Hypertension    Acute respiratory disease due to COVID-19 virus     PCP: Angelica Chessman, MD   REFERRING PROVIDER: Angelica Chessman, MD   REFERRING DIAG: [phone] (ICD-10-CM) - Neck muscle spasm   THERAPY DIAG:  Cervicalgia  Radiculopathy, cervical region  Other muscle spasm  Muscle weakness (generalized)  Abnormal posture  RATIONALE FOR EVALUATION AND TREATMENT: Rehabilitation  ONSET DATE: >2 months  NEXT MD VISIT: PRN   SUBJECTIVE:                                                                                                                                                                                                         SUBJECTIVE STATEMENT: Pt returning after a 2 week absence due to a having a  lithotripsy with complications requiring a f/u surgery. He was not able to any of his neck exercises while all of this was going on. He reports he has not had any pain in his neck but still gets a tingle now and then. He has been able to sleep on his L side.  Hand dominance: Right  PAIN: Are you having pain? No  PERTINENT  HISTORY:  R TKA 11/25/19, OA, HTN, adjustment disorder with anxiety, panic anxiety disorder, hearing loss  PRECAUTIONS: None  RED FLAGS: None  HAND DOMINANCE: Right  WEIGHT BEARING RESTRICTIONS: No  FALLS:  Has patient fallen in last 6 months? 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  PATIENT SURVEYS:  NDI 7 / 50 = 14.0 %  COGNITION: Overall cognitive status: Within functional limits for tasks assessed  SENSATION: WFL Intermittent L forearm & hand numbness and tingling  POSTURE:  rounded shoulders, forward head, and L shoulder elevated relative to R  PALPATION: Increased muscle tension t/o L neck and upper shoulder with TTP in L UT   CERVICAL ROM:   Active ROM Eval  Flexion 45 ^^  Extension 40 ^  Right lateral flexion 18 ^  Left lateral flexion 20 ^  Right rotation 48 ^  Left rotation 50 ^^   (Blank rows = not tested, ^ = increased pain/pulling)  UPPER EXTREMITY ROM:  Active ROM Right eval Left eval  Shoulder flexion  WNL WNL  Shoulder extension WNL WNL  Shoulder abduction WNL WNL ^  Shoulder adduction    Shoulder internal rotation FIR WNL FIR WNL  Shoulder external rotation FER T4 FER T3  Elbow flexion    Elbow extension    Wrist flexion    Wrist extension    Wrist ulnar deviation    Wrist radial deviation    Wrist pronation    Wrist supination     (Blank rows = not tested)  UPPER EXTREMITY MMT:  MMT Right eval Left eval  Shoulder flexion 4+ 4  Shoulder extension 4+ 4  Shoulder abduction 4+ 4  Shoulder adduction    Shoulder internal rotation 5 4+  Shoulder external rotation 4+ 4  Middle trapezius    Lower trapezius    Elbow flexion    Elbow extension    Wrist flexion    Wrist extension    Wrist ulnar deviation    Wrist radial deviation    Wrist pronation    Wrist supination    Grip strength 65 51.67   (Blank rows = not tested)  CERVICAL SPECIAL TESTS:  Spurling's test: Positive and Distraction test: Negative    TODAY'S TREATMENT:   12/08/22  THERAPEUTIC EXERCISE: to improve flexibility, strength and mobility.  Demonstration, verbal and tactile cues throughout for technique.  UBE - L2.0 x 6 min (3' each fwd & back) Standing scap retraction/depression + GTB B shoulder extension 10 x 5" Standing scap retraction/depression + GTB B shoulder row 10 x 5" Seated scap retraction/depression + GTB B shoulder ER 10 x 5"  MANUAL THERAPY: To promote normalized muscle tension, improved flexibility, improved joint mobility, increased ROM, and reduced pain. Skilled palpation and monitoring of soft tissue during DN Trigger Point Dry-Needling  Treatment instructions: Expect mild to moderate muscle soreness. S/S of pneumothorax if dry needled over a  lung field, and to seek immediate medical attention should they occur. Patient verbalized understanding of these instructions and education. Patient Consent Given: Yes Education handout provided: Previously provided Muscles treated: L UT, LS and  scalenes Electrical stimulation performed: No Parameters: N/A Treatment response/outcome: Twitch Response Elicited and Palpable Increase in Muscle Length STM/DTM, manual TPR and pin & stretch to muscles addressed with DN   11/24/22  THERAPEUTIC EXERCISE: to improve flexibility, strength and mobility.  Demonstration, verbal and tactile cues throughout for technique.  UBE - L2.0 x 4 min (2' each fwd & back) Seated cervical retraction 10 x 5" Seated gentle L UT stretch 3 x 30" Seated gentle L LS stretch 3 x 30" Seated scap retraction/depression + GTB B shoulder ER 10 x 5" Standing scap retraction/depression + GTB B shoulder row 10 x 5" Standing scap retraction/depression + GTB B shoulder extension 10 x 5" Seated cervical rotation SNAGs 10 x 3" Seated cervical extension SNAG 10 x 3"  MANUAL THERAPY: To promote normalized muscle tension, improved flexibility, improved joint mobility, increased ROM, and reduced pain. Skilled palpation and monitoring of soft tissue during DN Trigger Point Dry-Needling  Treatment instructions: Expect mild to moderate muscle soreness. S/S of pneumothorax if dry needled over a lung field, and to seek immediate medical attention should they occur. Patient verbalized understanding of these instructions and education. Patient Consent Given: Yes Education handout provided: Previously provided Muscles treated: L UT, LS and cervical paraspinals Electrical stimulation performed: No Parameters: N/A Treatment response/outcome: Twitch Response Elicited and Palpable Increase in Muscle Length STM/DTM, manual TPR and pin & stretch to muscles addressed with DN   11/20/22 - Eval THERAPEUTIC EXERCISE: to improve flexibility, strength and mobility.  Demonstration, verbal and tactile cues throughout for technique.  Supine cervical retraction 10 x 5" Seated gentle L UT stretch x 30" Seated gentle L LS stretch x 30" Seated scap retraction and depression 10 x 5"   PATIENT  EDUCATION:  Education details: role of DN and DN rational, procedure, outcomes, potential side effects, and recommended post-treatment exercises/activity  Person educated: Patient Education method: Explanation and Handouts Education comprehension: verbalized understanding  HOME EXERCISE PROGRAM: Access Code: [facility] URL: [email or web address] Date: 11/24/2022 Prepared by: Glenetta Hew  Exercises - Supine Cervical Retraction with Towel  - 2 x daily - 7 x weekly - 2 sets - 10 reps - 3-5 sec hold - Seated Gentle Upper Trapezius Stretch (Mirrored)  - 2 x daily - 7 x weekly - 3 reps - 30 sec hold - Gentle Levator Scapulae Stretch  - 2 x daily - 7 x weekly - 3 reps - 30 sec hold - Seated Scapular Retraction  - 2 x daily - 7 x weekly - 2 sets - 10 reps - 5 sec hold - Shoulder External Rotation and Scapular Retraction with Resistance  - 1 x daily - 7 x weekly - 2 sets - 10 reps - 3-5 sec hold - Standing Bilateral Low Shoulder Row with Anchored Resistance  - 1 x daily - 7 x weekly - 2 sets - 10 reps - 5 sec hold - Scapular Retraction with Resistance Advanced  - 1 x daily - 7 x weekly - 2 sets - 10 reps - 5 sec hold - Seated Assisted Cervical Rotation with Towel  - 1 x daily - 7 x weekly - 2 sets - 10 reps - 3 sec hold - Mid-Lower Cervical Extension SNAG with Strap  - 1 x daily - 7 x weekly - 2  sets - 10 reps - 3 sec hold  Patient Education - Trigger Point Dry Needling   ASSESSMENT:  CLINICAL IMPRESSION: [place] notes benefit from the DN last session, with less pain and now able to resume sleeping on his L side, but states he was limited in his ability to complete his HEP while dealing with his kidney stone. HEP reviewed clarifying technique for GTB resisted scapular/postural strengthening, but he denied need for review of cervical SNAG assisted AROM.  Cervical and shoulder muscle tension improved however a few taut bands and trigger points still present in L upper shoulder/lateral  neck and patient continues to report intermittent numbness and tingling in L UE down to thumb, therefore continued with MT eating DN to address ongoing abnormal muscle tension with good twitch responses elicited resulting in further reduction in muscle tension.  Reviewed post DN instructions and encouraged continued performance of HEP to further normalize muscle tension.  Shoaib will benefit from continue skilled PT to address ongoing muscle tension, ROM and strength deficits to improve mobility and activity tolerance with decreased pain interference.   OBJECTIVE IMPAIRMENTS: decreased activity tolerance, decreased knowledge of condition, decreased mobility, decreased ROM, decreased strength, hypomobility, increased fascial restrictions, impaired perceived functional ability, increased muscle spasms, impaired flexibility, impaired sensation, impaired UE functional use, improper body mechanics, postural dysfunction, and pain.   ACTIVITY LIMITATIONS: carrying, lifting, sleeping, and reach over head  PARTICIPATION LIMITATIONS: driving, occupation, and yard work  PERSONAL FACTORS: Fitness, Past/current experiences, Time since onset of injury/illness/exacerbation, and 3+ comorbidities: R TKA 11/25/19, OA, HTN, adjustment disorder with anxiety, panic anxiety disorder, hearing loss  are also affecting patient's functional outcome.   REHAB POTENTIAL: Excellent  CLINICAL DECISION MAKING: Stable/uncomplicated  EVALUATION COMPLEXITY: Low   GOALS: Goals reviewed with patient? Yes  SHORT TERM GOALS: Target date: 12/11/2022  Patient will be independent with initial HEP to improve outcomes and carryover.  Baseline: Initial HEP on eval Goal status: MET  12/08/22  2.  Patient will report centralization of L UE numbness and tingling. Baseline: Intermittent numbness and tingling in L forearm and hand Goal status: IN PROGRESS  12/08/22 - still having occasional tingling into L thumb  LONG TERM GOALS: Target  date: 01/01/2023   Patient will be independent with ongoing/advanced HEP for self-management at home.  Baseline:  Goal status: IN PROGRESS  2.  Patient will demonstrate improved posture to decrease muscle imbalance. Baseline: Forward head and rounded shoulder posture with L shoulder elevated Goal status: IN PROGRESS  3.  Patient will report 50-75% improvement in neck pain to improve QOL.  Baseline: 2-3/10 Goal status: IN PROGRESS  4.  Patient to report 50-75% reduction in frequency and intensity of L UE numbness and tingling.   Baseline: Intermittent numbness and tingling in L forearm and hand Goal status: IN PROGRESS   5.  Patient will demonstrate functional pain free cervical ROM for safety with driving.  Baseline: Refer to above cervical ROM table Goal status: IN PROGRESS  6.  Patient will report </= 8% on NDI to demonstrate improved functional ability.  Baseline: 7 / 50 = 14.0 % Goal status: IN PROGRESS  7.  Patient will ability to sleep in preferred position without limitation due to pain or L L UE numbness and tingling. Baseline: Unable to sleep on L side Goal status: IN PROGRESS     PLAN:  PT FREQUENCY: 1-2x/week - starting 2x/wk, potentially tapering to 1x/wk  PT DURATION: 6 weeks  PLANNED INTERVENTIONS: Therapeutic exercises, Therapeutic activity,  Neuromuscular re-education, Patient/Family education, Self Care, Joint mobilization, Dry Needling, Electrical stimulation, Spinal manipulation, Spinal mobilization, Taping, Traction, Ultrasound, Manual therapy, and Re-evaluation  PLAN FOR NEXT SESSION: Assess response to DN; gently progress cervical ROM/stretching and postural strengthening - review/update HEP PRN; MT +/- DN to address abnormal muscle tension in L UT, LS and cervical paraspinals; modalities as needed for pain control   Marry Guan, PT 12/08/2022, 12:50 PM

## 2022-12-11 ENCOUNTER — Ambulatory Visit: Payer: Medicare HMO | Admitting: Physical Therapy

## 2022-12-11 ENCOUNTER — Encounter: Payer: Self-pay | Admitting: Physical Therapy

## 2022-12-11 DIAGNOSIS — R293 Abnormal posture: Secondary | ICD-10-CM

## 2022-12-11 DIAGNOSIS — M6281 Muscle weakness (generalized): Secondary | ICD-10-CM

## 2022-12-11 DIAGNOSIS — M5412 Radiculopathy, cervical region: Secondary | ICD-10-CM

## 2022-12-11 DIAGNOSIS — M62838 Other muscle spasm: Secondary | ICD-10-CM

## 2022-12-11 DIAGNOSIS — M542 Cervicalgia: Secondary | ICD-10-CM

## 2022-12-11 NOTE — Therapy (Signed)
OUTPATIENT PHYSICAL THERAPY TREATMENT   Patient Name: Brent Ho MRN: [id number] DOB:1945/07/13, 77 y.o., male Today's Date: 12/11/2022   END OF SESSION:  PT End of Session - 12/11/22 1446     Visit Number 4    Date for PT Re-Evaluation 01/01/23    Authorization Type [employer]    Progress Note Due on Visit 10    PT Start Time 1446    PT Stop Time 1533    PT Time Calculation (min) 47 min    Activity Tolerance Patient tolerated treatment well    Behavior During Therapy WFL for tasks assessed/performed             Past Medical History:  Diagnosis Date   Anxiety    Arthritis    Hypercholesteremia    Hypertension    Pneumonia    hx of    Past Surgical History:  Procedure Laterality Date   BUNIONECTOMY     TOTAL KNEE ARTHROPLASTY Right 11/25/2019   Procedure: TOTAL KNEE ARTHROPLASTY;  Surgeon: Durene Romans, MD;  Location: [facility];  Service: Orthopedics;  Laterality: Right;  70 mins   Patient Active Problem List   Diagnosis Date Noted   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   Sepsis (HCC) 07/12/2018   COVID-19 virus infection 07/12/2018   Acute respiratory failure with hypoxia (HCC) 07/12/2018   Depression 07/12/2018   Hypercholesteremia    Hypertension    Acute respiratory disease due to COVID-19 virus     PCP: Angelica Chessman, MD   REFERRING PROVIDER: Angelica Chessman, MD   REFERRING DIAG: [phone] (ICD-10-CM) - Neck muscle spasm   THERAPY DIAG:  Cervicalgia  Radiculopathy, cervical region  Other muscle spasm  Muscle weakness (generalized)  Abnormal posture  RATIONALE FOR EVALUATION AND TREATMENT: Rehabilitation  ONSET DATE: >2 months  NEXT MD VISIT: PRN   SUBJECTIVE:                                                                                                                                                                                                         SUBJECTIVE STATEMENT: Pt reports his pain has essentially been gone since we started  working with the dry needles. He has been consistently sleeping on his L side w/o issues.  Hand dominance: Right  PAIN: Are you having pain? No  PERTINENT HISTORY:  R TKA 11/25/19, OA, HTN, adjustment disorder with anxiety, panic anxiety disorder, hearing loss  PRECAUTIONS: None  RED FLAGS: None  HAND DOMINANCE: Right  WEIGHT BEARING RESTRICTIONS: No  FALLS:  Has patient fallen  in last 6 months? 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  PATIENT SURVEYS:  NDI 7 / 50 = 14.0 %  COGNITION: Overall cognitive status: Within functional limits for tasks assessed  SENSATION: WFL Intermittent L forearm & hand numbness and tingling  POSTURE:  rounded shoulders, forward head, and L shoulder elevated relative to R  PALPATION: Increased muscle tension t/o L neck and upper shoulder with TTP in L UT   CERVICAL ROM:   Active ROM Eval 12/11/22  Flexion 45 ^^ 55 chin to chest  Extension 40 ^ 44  Right lateral flexion 18 ^ 33  Left lateral flexion 20 ^ 25  Right rotation 48 ^ 60  Left rotation 50 ^^ 60   (Blank rows = not tested, ^ = increased pain/pulling)  UPPER EXTREMITY ROM:  Active ROM Right eval Left eval  Shoulder flexion WNL WNL  Shoulder extension WNL WNL  Shoulder abduction WNL WNL ^  Shoulder adduction    Shoulder internal rotation FIR WNL  FIR WNL  Shoulder external rotation FER T4 FER T3  Elbow flexion    Elbow extension    Wrist flexion    Wrist extension    Wrist ulnar deviation    Wrist radial deviation    Wrist pronation    Wrist supination     (Blank rows = not tested)  UPPER EXTREMITY MMT:  MMT Right eval Left eval  Shoulder flexion 4+ 4  Shoulder extension 4+ 4  Shoulder abduction 4+ 4  Shoulder adduction    Shoulder internal rotation 5 4+  Shoulder external rotation 4+ 4  Middle trapezius    Lower trapezius    Elbow flexion    Elbow extension    Wrist flexion    Wrist extension    Wrist ulnar deviation    Wrist radial deviation    Wrist pronation    Wrist supination    Grip strength 65 51.67   (Blank rows = not tested)  CERVICAL SPECIAL TESTS:  Spurling's test: Positive and Distraction test: Negative    TODAY'S TREATMENT:   12/11/22  THERAPEUTIC EXERCISE: to improve flexibility, strength and mobility.  Demonstration, verbal and tactile cues throughout for technique.  UBE - L2.5 x 6 min (3' each fwd & back) Prone over green Pball - thoracic extension, cervical + scap retraction with I's, T's, Y's and W's  x 10 each - most difficulty noted with Y's Lower trap setting at wall - "V" overhead slide with lift-off x 10 Standing scap retraction + GTB B shoulder horiz ABD 10 x 5" with back along doorframe to promote scap engagement Standing scap retraction + GTB B shoulder horiz ABD diagonals 10 x 5" each with back along doorframe to promote scap engagement Wall push-up plus x 10  THERAPEUTIC ACTIVITIES: Cervical ROM assessment   12/08/22  THERAPEUTIC EXERCISE: to improve flexibility, strength and mobility.  Demonstration, verbal  and tactile cues throughout for technique.  UBE - L2.0 x 6 min (3' each fwd & back) Standing scap retraction/depression + GTB B shoulder extension 10 x 5" Standing scap retraction/depression + GTB B shoulder row 10 x 5" Seated scap retraction/depression + GTB B shoulder  ER 10 x 5"  MANUAL THERAPY: To promote normalized muscle tension, improved flexibility, improved joint mobility, increased ROM, and reduced pain. Skilled palpation and monitoring of soft tissue during DN Trigger Point Dry-Needling  Treatment instructions: Expect mild to moderate muscle soreness. S/S of pneumothorax if dry needled over a lung field, and to seek immediate medical attention should they occur. Patient verbalized understanding of these instructions and education. Patient Consent Given: Yes Education handout provided: Previously provided Muscles treated: L UT, LS and scalenes Electrical stimulation performed: No Parameters: N/A Treatment response/outcome: Twitch Response Elicited and Palpable Increase in Muscle Length STM/DTM, manual TPR and pin & stretch to muscles addressed with DN   11/24/22  THERAPEUTIC EXERCISE: to improve flexibility, strength and mobility.  Demonstration, verbal and tactile cues throughout for technique.  UBE - L2.0 x 4 min (2' each fwd & back) Seated cervical retraction 10 x 5" Seated gentle L UT stretch 3 x 30" Seated gentle L LS stretch 3 x 30" Seated scap retraction/depression + GTB B shoulder ER 10 x 5" Standing scap retraction/depression + GTB B shoulder row 10 x 5" Standing scap retraction/depression + GTB B shoulder extension 10 x 5" Seated cervical rotation SNAGs 10 x 3" Seated cervical extension SNAG 10 x 3"  MANUAL THERAPY: To promote normalized muscle tension, improved flexibility, improved joint mobility, increased ROM, and reduced pain. Skilled palpation and monitoring of soft tissue during DN Trigger Point Dry-Needling  Treatment instructions: Expect mild to moderate muscle soreness. S/S of pneumothorax if dry needled over a lung field, and to seek immediate medical attention should they occur. Patient verbalized understanding of these instructions and education. Patient Consent Given: Yes Education handout provided: Previously  provided Muscles treated: L UT, LS and cervical paraspinals Electrical stimulation performed: No Parameters: N/A Treatment response/outcome: Twitch Response Elicited and Palpable Increase in Muscle Length STM/DTM, manual TPR and pin & stretch to muscles addressed with DN   PATIENT EDUCATION:  Education details: role of DN and DN rational, procedure, outcomes, potential side effects, and recommended post-treatment exercises/activity  Person educated: Patient Education method: Explanation and Handouts Education comprehension: verbalized understanding  HOME EXERCISE PROGRAM: Access Code: [facility] URL: [email or web address] Date: 11/24/2022 Prepared by: Glenetta Hew  Exercises - Supine Cervical Retraction with Towel  - 2 x daily - 7 x weekly - 2 sets - 10 reps - 3-5 sec hold - Seated Gentle Upper Trapezius Stretch (Mirrored)  - 2 x daily - 7 x weekly - 3 reps - 30 sec hold - Gentle Levator Scapulae Stretch  - 2 x daily - 7 x weekly - 3 reps - 30 sec hold - Seated Scapular Retraction  - 2 x daily - 7 x weekly - 2 sets - 10 reps - 5 sec hold - Shoulder External Rotation and Scapular Retraction with Resistance  - 1 x daily - 7 x weekly - 2 sets - 10 reps - 3-5 sec hold - Standing Bilateral Low Shoulder Row with Anchored Resistance  - 1 x daily - 7 x weekly - 2 sets - 10 reps - 5 sec hold - Scapular Retraction with Resistance Advanced  - 1 x daily - 7 x weekly - 2 sets - 10 reps -  5 sec hold - Seated Assisted Cervical Rotation with Towel  - 1 x daily - 7 x weekly - 2 sets - 10 reps - 3 sec hold - Mid-Lower Cervical Extension SNAG with Strap  - 1 x daily - 7 x weekly - 2 sets - 10 reps - 3 sec hold  Patient Education - Trigger Point Dry Needling   ASSESSMENT:  CLINICAL IMPRESSION: Tellas reports his pain has remained well controlled since we started the dry needling, allowing him to resume sleeping consistently on his L side. He declined need for DN today and expressed  interest progressing the strengthening exercises as he will return to work tomorrow. Increased fatigue noted with prone exercises, esp Y's, but otherwise exercises well tolerated with no increased pain. Cervical ROM reassessed with motion now essentially WFL/WNL with no increased pain triggered. Kerel will benefit from continue skilled PT to address ongoing muscle tension, ROM and strength deficits to improve mobility and activity tolerance with decreased pain interference.   OBJECTIVE IMPAIRMENTS: decreased activity tolerance, decreased knowledge of condition, decreased mobility, decreased ROM, decreased strength, hypomobility, increased fascial restrictions, impaired perceived functional ability, increased muscle spasms, impaired flexibility, impaired sensation, impaired UE functional use, improper body mechanics, postural dysfunction, and pain.   ACTIVITY LIMITATIONS: carrying, lifting, sleeping, and reach over head  PARTICIPATION LIMITATIONS: driving, occupation, and yard work  PERSONAL FACTORS: Fitness, Past/current experiences, Time since onset of injury/illness/exacerbation, and 3+ comorbidities: R TKA 11/25/19, OA, HTN, adjustment disorder with anxiety, panic anxiety disorder, hearing loss  are also affecting patient's functional outcome.   REHAB POTENTIAL: Excellent  CLINICAL DECISION MAKING: Stable/uncomplicated  EVALUATION COMPLEXITY: Low   GOALS: Goals reviewed with patient? Yes  SHORT TERM GOALS: Target date: 12/11/2022  Patient will be independent with initial HEP to improve outcomes and carryover.  Baseline: Initial HEP on eval Goal status: MET  12/08/22  2.  Patient will report centralization of L UE numbness and tingling. Baseline: Intermittent numbness and tingling in L forearm and hand Goal status: IN PROGRESS  12/08/22 - still having occasional tingling into L thumb  LONG TERM GOALS: Target date: 01/01/2023   Patient will be independent with ongoing/advanced HEP for  self-management at home.  Baseline:  Goal status: IN PROGRESS  2.  Patient will demonstrate improved posture to decrease muscle imbalance. Baseline: Forward head and rounded shoulder posture with L shoulder elevated Goal status: IN PROGRESS  3.  Patient will report 50-75% improvement in neck pain to improve QOL.  Baseline: 2-3/10 Goal status: IN PROGRESS  4.  Patient to report 50-75% reduction in frequency and intensity of L UE numbness and tingling.   Baseline: Intermittent numbness and tingling in L forearm and hand Goal status: IN PROGRESS   5.  Patient will demonstrate functional pain free cervical ROM for safety with driving.  Baseline: Refer to above cervical ROM table Goal status: MET  12/11/22  6.  Patient will report </= 8% on NDI to demonstrate improved functional ability.  Baseline: 7 / 50 = 14.0 % Goal status: IN PROGRESS  7.  Patient will ability to sleep in preferred position without limitation due to pain or L L UE numbness and tingling. Baseline: Unable to sleep on L side Goal status: IN PROGRESS     PLAN:  PT FREQUENCY: 1-2x/week - starting 2x/wk, potentially tapering to 1x/wk  PT DURATION: 6 weeks  PLANNED INTERVENTIONS: Therapeutic exercises, Therapeutic activity, Neuromuscular re-education, Patient/Family education, Self Care, Joint mobilization, Dry Needling, Electrical stimulation, Spinal manipulation, Spinal  mobilization, Taping, Traction, Ultrasound, Manual therapy, and Re-evaluation  PLAN FOR NEXT SESSION: Assess response to return to work; progress cervical ROM/stretching and postural strengthening - review/update HEP PRN; MT +/- DN to address abnormal muscle tension in L UT, LS and cervical paraspinals; modalities as needed for pain control   Marry Guan, PT 12/11/2022, 6:10 PM

## 2022-12-15 ENCOUNTER — Encounter: Payer: Self-pay | Admitting: Physical Therapy

## 2022-12-15 ENCOUNTER — Ambulatory Visit: Payer: Medicare HMO | Admitting: Physical Therapy

## 2022-12-15 DIAGNOSIS — R293 Abnormal posture: Secondary | ICD-10-CM

## 2022-12-15 DIAGNOSIS — M62838 Other muscle spasm: Secondary | ICD-10-CM

## 2022-12-15 DIAGNOSIS — M5412 Radiculopathy, cervical region: Secondary | ICD-10-CM

## 2022-12-15 DIAGNOSIS — M542 Cervicalgia: Secondary | ICD-10-CM

## 2022-12-15 DIAGNOSIS — M6281 Muscle weakness (generalized): Secondary | ICD-10-CM

## 2022-12-15 NOTE — Therapy (Signed)
OUTPATIENT PHYSICAL THERAPY TREATMENT   Patient Name: Brent Ho MRN: [id number] DOB:12-28-1945, 77 y.o., male Today's Date: 12/15/2022   END OF SESSION:  PT End of Session - 12/15/22 0845     Visit Number 5    Date for PT Re-Evaluation 01/01/23    Authorization Type [employer]    Progress Note Due on Visit 10    PT Start Time 0845    PT Stop Time 0926    PT Time Calculation (min) 41 min    Activity Tolerance Patient tolerated treatment well    Behavior During Therapy [facility] for tasks assessed/performed              Past Medical History:  Diagnosis Date   Anxiety    Arthritis    Hypercholesteremia    Hypertension    Pneumonia    hx of    Past Surgical History:  Procedure Laterality Date   BUNIONECTOMY     TOTAL KNEE ARTHROPLASTY Right 11/25/2019   Procedure: TOTAL KNEE ARTHROPLASTY;  Surgeon: Durene Romans, MD;  Location: [facility];  Service: Orthopedics;  Laterality: Right;  70 mins   Patient Active Problem List   Diagnosis Date Noted   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   Sepsis (HCC) 07/12/2018   COVID-19 virus infection 07/12/2018   Acute respiratory failure with hypoxia (HCC) 07/12/2018   Depression 07/12/2018   Hypercholesteremia    Hypertension    Acute respiratory disease due to COVID-19 virus     PCP: Angelica Chessman, MD   REFERRING PROVIDER: Angelica Chessman, MD   REFERRING DIAG: [phone] (ICD-10-CM) - Neck muscle spasm   THERAPY DIAG:  Cervicalgia  Radiculopathy, cervical region  Other muscle spasm  Muscle weakness (generalized)  Abnormal posture  RATIONALE FOR EVALUATION AND TREATMENT: Rehabilitation  ONSET DATE: >2 months  NEXT MD VISIT: PRN   SUBJECTIVE:                                                                                                                                                                                                         SUBJECTIVE STATEMENT: Pt reports his first week back to work was rough - notes  muscle soreness in his back from doing things he is not used to but did not seem to increase the neck or shoulder pain. He does feel sleeping on the L side more is still triggering some of the numbness but otherwise he has not been able to identify any other specific triggers.  Hand dominance: Right  PAIN: Are you having pain? Yes: NPRS  scale: 1/10 Pain location: L thumb and forefinger Pain description: tingling Aggravating factors: sleeping on L side, nothing specific Relieving factors: resolves on its own after a short time  PERTINENT HISTORY:  R TKA 11/25/19, OA, HTN, adjustment disorder with anxiety, panic anxiety disorder, hearing loss  PRECAUTIONS: None  RED FLAGS: None  HAND DOMINANCE: Right  WEIGHT BEARING RESTRICTIONS: No  FALLS:  Has patient fallen in last 6 months? 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  PATIENT SURVEYS:  NDI 7 / 50 = 14.0 %  COGNITION: Overall cognitive status: Within functional limits for tasks assessed  SENSATION: WFL Intermittent L forearm & hand numbness and tingling  POSTURE:  rounded shoulders, forward head, and L shoulder elevated relative to R  PALPATION: Increased muscle tension t/o L neck and upper shoulder with TTP in L UT   CERVICAL ROM:   Active ROM Eval 12/11/22  Flexion 45 ^^ 55 chin to chest   Extension 40 ^ 44  Right lateral flexion 18 ^ 33  Left lateral flexion 20 ^ 25  Right rotation 48 ^ 60  Left rotation 50 ^^ 60   (Blank rows = not tested, ^ = increased pain/pulling)  UPPER EXTREMITY ROM:  Active ROM Right eval Left eval  Shoulder flexion WNL WNL  Shoulder extension WNL WNL  Shoulder abduction WNL WNL ^  Shoulder adduction    Shoulder internal rotation FIR WNL FIR WNL  Shoulder external rotation FER T4 FER T3  Elbow flexion    Elbow extension    Wrist flexion    Wrist extension    Wrist ulnar deviation    Wrist radial deviation    Wrist pronation    Wrist supination     (Blank rows = not tested)  UPPER EXTREMITY MMT:  MMT Right eval Left eval  Shoulder flexion 4+ 4  Shoulder extension 4+ 4  Shoulder abduction 4+ 4  Shoulder adduction    Shoulder internal rotation 5 4+  Shoulder external rotation 4+ 4  Middle trapezius    Lower trapezius    Elbow flexion    Elbow extension    Wrist flexion    Wrist extension    Wrist ulnar deviation    Wrist radial deviation    Wrist pronation    Wrist supination    Grip strength 65 51.67   (Blank rows = not tested)  CERVICAL SPECIAL TESTS:  Spurling's test: Positive and Distraction test: Negative    TODAY'S TREATMENT:   12/15/22  THERAPEUTIC EXERCISE: to improve flexibility, strength and mobility.  Demonstration, verbal and tactile cues throughout for technique.  UBE - L3.0 x 6 min (3' each fwd & back) Median nerve flossing/glides - 3 patterns attempted x 10 each - pt instructed to go with the pattern that best helps with the tingling Seated L UT stretches 2 x 30" - varying head position to better target desired  stretch Seated L LS stretch x 30"  MANUAL THERAPY: To promote normalized muscle tension, improved flexibility, and reduced pain. Skilled palpation and monitoring of soft tissue during DN Trigger Point Dry-Needling  Treatment instructions: Expect mild to moderate muscle soreness. S/S of  pneumothorax if dry needled over a lung field, and to seek immediate medical attention should they occur. Patient verbalized understanding of these instructions and education. Patient Consent Given: Yes Education handout provided: Previously provided Muscles treated: L UT x 2,  Electrical stimulation performed: No Parameters: N/A Treatment response/outcome: Twitch Response Elicited and Palpable Increase in Muscle Length STM/DTM, manual TPR and pin & stretch to muscles addressed with DN   12/11/22  THERAPEUTIC EXERCISE: to improve flexibility, strength and mobility.  Demonstration, verbal and tactile cues throughout for technique.  UBE - L2.5 x 6 min (3' each fwd & back) Prone over green Pball - thoracic extension, cervical + scap retraction with I's, T's, Y's and W's  x 10 each - most difficulty noted with Y's Lower trap setting at wall - "V" overhead slide with lift-off x 10 Standing scap retraction + GTB B shoulder horiz ABD 10 x 5" with back along doorframe to promote scap engagement Standing scap retraction + GTB B shoulder horiz ABD diagonals 10 x 5" each with back along doorframe to promote scap engagement Wall push-up plus x 10  THERAPEUTIC ACTIVITIES: Cervical ROM assessment   12/08/22  THERAPEUTIC EXERCISE: to improve flexibility, strength and mobility.  Demonstration, verbal and tactile cues throughout for technique.  UBE - L2.0 x 6 min (3' each fwd & back) Standing scap retraction/depression + GTB B shoulder extension 10 x 5" Standing scap retraction/depression + GTB B shoulder row 10 x 5" Seated scap retraction/depression + GTB B shoulder ER 10 x 5"  MANUAL THERAPY: To promote normalized muscle tension, improved flexibility, improved joint mobility, increased ROM, and reduced pain. Skilled palpation and monitoring of soft tissue during DN Trigger Point Dry-Needling  Treatment instructions: Expect mild to moderate muscle soreness. S/S of pneumothorax if dry needled over a  lung field, and to seek immediate medical attention should they occur. Patient verbalized understanding of these instructions and education. Patient Consent Given: Yes Education handout provided: Previously provided Muscles treated: L UT, LS and scalenes Electrical stimulation performed: No Parameters: N/A Treatment response/outcome: Twitch Response Elicited and Palpable Increase in Muscle Length STM/DTM, manual TPR and pin & stretch to muscles addressed with DN   PATIENT EDUCATION:  Education details: HEP progression - median nerve glides, role of DN, and DN rational, procedure, outcomes, potential side effects, and recommended post-treatment exercises/activity  Person educated: Patient Education method: Explanation and Handouts Education comprehension: verbalized understanding  HOME EXERCISE PROGRAM: Access Code: [facility] URL: [email or web address] Date: 12/15/2022 Prepared by: Glenetta Hew  Exercises - Supine Cervical Retraction with Towel  - 2 x daily - 7 x weekly - 2 sets - 10 reps - 3-5 sec hold - Seated Gentle Upper Trapezius Stretch (Mirrored)  - 2 x daily - 7 x weekly - 3 reps - 30 sec hold - Gentle Levator Scapulae Stretch  - 2 x daily - 7 x weekly - 3 reps - 30 sec hold - Seated Scapular Retraction  - 2 x daily - 7 x weekly - 2 sets - 10 reps - 5 sec hold - Shoulder External Rotation and Scapular Retraction with Resistance  - 1 x daily - 7 x weekly - 2 sets - 10 reps - 3-5 sec hold - Standing Bilateral Low  Shoulder Row with Anchored Resistance  - 1 x daily - 7 x weekly - 2 sets - 10 reps - 5 sec hold - Scapular Retraction with Resistance Advanced  - 1 x daily - 7 x weekly - 2 sets - 10 reps - 5 sec hold - Seated Assisted Cervical Rotation with Towel  - 1 x daily - 7 x weekly - 2 sets - 10 reps - 3 sec hold - Mid-Lower Cervical Extension SNAG with Strap  - 1 x daily - 7 x weekly - 2 sets - 10 reps - 3 sec hold - Median Nerve Flossing  - 1 x daily - 7 x weekly -  2 sets - 10 reps - 3 sec hold - Median Nerve Flossing - Tray  - 1 x daily - 7 x weekly - 2 sets - 10 reps - 3 sec hold - Standing Median Nerve Glide  - 1 x daily - 7 x weekly - 2 sets - 10 reps - 3 sec hold  Patient Education - Trigger Point Dry Needling   ASSESSMENT:  CLINICAL IMPRESSION: Kylo reports it was a rough week returning to work this week, noting more soreness in his back from doing things with which he is out of practice, but denying any increased neck or shoulder pain. He still notes intermittent tingling in his L thumb and forefinger w/o identifiable trigger, although he wonders if it has been because he is now sleeping more on his L side. Introduced median nerve flossing/glides to reduce potential restriction of nerve mobility and promote reduction in incidence of tingling - pt provided with 3 options and instructed to perform the version he finds most comfortable and effective. Increased muscle tension and taut bands still present in L upper and lateral shoulder which may also be contributing to altered nerve sensation. Addressed abnormal muscle tension with MT incorporating DN with good twitch responses elicited resulting in palpable reduction in muscle tension. Reviewed relevant stretches and encouraged continued performance of HEP to further normalize muscle activity. Branigan will benefit from continue skilled PT to address ongoing muscle tension, ROM and strength deficits to improve mobility and activity tolerance with decreased pain interference.   OBJECTIVE IMPAIRMENTS: decreased activity tolerance, decreased knowledge of condition, decreased mobility, decreased ROM, decreased strength, hypomobility, increased fascial restrictions, impaired perceived functional ability, increased muscle spasms, impaired flexibility, impaired sensation, impaired UE functional use, improper body mechanics, postural dysfunction, and pain.   ACTIVITY LIMITATIONS: carrying, lifting, sleeping, and reach  over head  PARTICIPATION LIMITATIONS: driving, occupation, and yard work  PERSONAL FACTORS: Fitness, Past/current experiences, Time since onset of injury/illness/exacerbation, and 3+ comorbidities: R TKA 11/25/19, OA, HTN, adjustment disorder with anxiety, panic anxiety disorder, hearing loss  are also affecting patient's functional outcome.   REHAB POTENTIAL: Excellent  CLINICAL DECISION MAKING: Stable/uncomplicated  EVALUATION COMPLEXITY: Low   GOALS: Goals reviewed with patient? Yes  SHORT TERM GOALS: Target date: 12/11/2022  Patient will be independent with initial HEP to improve outcomes and carryover.  Baseline: Initial HEP on eval Goal status: MET  12/08/22  2.  Patient will report centralization of L UE numbness and tingling. Baseline: Intermittent numbness and tingling in L forearm and hand Goal status: IN PROGRESS  12/15/22 - still having occasional tingling into L thumb & forefinger  LONG TERM GOALS: Target date: 01/01/2023   Patient will be independent with ongoing/advanced HEP for self-management at home.  Baseline:  Goal status: IN PROGRESS  2.  Patient will demonstrate improved posture to  decrease muscle imbalance. Baseline: Forward head and rounded shoulder posture with L shoulder elevated Goal status: IN PROGRESS  3.  Patient will report 50-75% improvement in neck pain to improve QOL.  Baseline: 2-3/10 Goal status: IN PROGRESS  4.  Patient to report 50-75% reduction in frequency and intensity of L UE numbness and tingling.   Baseline: Intermittent numbness and tingling in L forearm and hand Goal status: IN PROGRESS   5.  Patient will demonstrate functional pain free cervical ROM for safety with driving.  Baseline: Refer to above cervical ROM table Goal status: MET  12/11/22  6.  Patient will report </= 8% on NDI to demonstrate improved functional ability.  Baseline: 7 / 50 = 14.0 % Goal status: IN PROGRESS  7.  Patient will ability to sleep in preferred  position without limitation due to pain or L L UE numbness and tingling. Baseline: Unable to sleep on L side Goal status: PARTIALLY MET  12/15/22 - Consistently sleeping on L side but still experiencing intermittent L UE tingling    PLAN:  PT FREQUENCY: 1-2x/week - starting 2x/wk, potentially tapering to 1x/wk  PT DURATION: 6 weeks  PLANNED INTERVENTIONS: Therapeutic exercises, Therapeutic activity, Neuromuscular re-education, Patient/Family education, Self Care, Joint mobilization, Dry Needling, Electrical stimulation, Spinal manipulation, Spinal mobilization, Taping, Traction, Ultrasound, Manual therapy, and Re-evaluation  PLAN FOR NEXT SESSION: Assess response to nerve glides/flossing and DN; progress cervical ROM/stretching and postural strengthening - review/update HEP PRN; MT +/- DN to address abnormal muscle tension in L UT, LS and cervical paraspinals; modalities as needed for pain control   Marry Guan, PT 12/15/2022, 10:54 AM

## 2022-12-22 ENCOUNTER — Encounter: Payer: Medicare HMO | Admitting: Physical Therapy

## 2022-12-25 ENCOUNTER — Ambulatory Visit: Payer: Medicare HMO | Admitting: Physical Therapy

## 2022-12-29 ENCOUNTER — Ambulatory Visit: Payer: Medicare HMO | Admitting: Physical Therapy

## 2024-04-05 ENCOUNTER — Ambulatory Visit
Admission: EM | Admit: 2024-04-05 | Discharge: 2024-04-05 | Disposition: A | Discharge status: Home / Self Care | Attending: Family Medicine | Admitting: Family Medicine

## 2024-04-05 ENCOUNTER — Ambulatory Visit: Payer: Self-pay | Admitting: Nurse Practitioner

## 2024-04-05 ENCOUNTER — Ambulatory Visit (INDEPENDENT_AMBULATORY_CARE_PROVIDER_SITE_OTHER)

## 2024-04-05 DIAGNOSIS — J209 Acute bronchitis, unspecified: Secondary | ICD-10-CM | POA: Diagnosis not present

## 2024-04-05 DIAGNOSIS — R051 Acute cough: Secondary | ICD-10-CM

## 2024-04-05 MED ORDER — BENZONATATE 200 MG PO CAPS: 200.0000 mg | ORAL_CAPSULE | Freq: Three times a day (TID) | ORAL | 0 refills | Status: AC | PRN

## 2024-04-05 MED ORDER — ALBUTEROL SULFATE HFA 108 (90 BASE) MCG/ACT IN AERS: 1.0000 | INHALATION_SPRAY | Freq: Four times a day (QID) | RESPIRATORY_TRACT | 0 refills | Status: AC | PRN

## 2024-04-05 MED ORDER — DEXAMETHASONE SOD PHOSPHATE PF 10 MG/ML IJ SOLN
8.0000 mg | Freq: Once | INTRAMUSCULAR | Status: AC
  Administered 2024-04-05: 8 mg via INTRAMUSCULAR

## 2024-04-05 MED ORDER — AZITHROMYCIN 250 MG PO TABS: 250.0000 mg | ORAL_TABLET | Freq: Every day | ORAL | 0 refills | Status: AC

## 2024-04-05 NOTE — Discharge Instructions (Signed)
 Start azithromycin  antibiotic as prescribed.  You may take Tessalon  3 times a day as needed for your cough.  You are given a shot of a steroid in clinic With your cough as well.  May use albuterol  inhaler as needed for any wheezing or shortness of breath.  Lots of rest and fluids.  Follow-up with your PCP in 2 to 3 days for recheck.  Please go to the emergency room for any worsening symptoms.  Hope you feel better soon!

## 2024-04-05 NOTE — ED Triage Notes (Signed)
 Pt present with a cough x 6 days. Pt states he has trouble breathing after a coughing a lot and hard. States this is the second day he has taken Delsym . Pt had applied Vicks vapo rub on the chest to loosen up mucus.

## 2024-04-05 NOTE — ED Provider Notes (Signed)
 " [facility]    CSN: [id number] Arrival date & time: 04/05/24  1221      History   Chief Complaint Chief Complaint  Patient presents with   Cough    HPI Brent Ho is a 79 y.o. male  presents for evaluation of URI symptoms for 6 days. Patient reports associated symptoms of cough, congestion, shortness of breath with coughing. Denies N/V/D, fevers, sore throat, ear pain, body aches. Patient does not have a hx of asthma. Patient is not an active smoker.   Reports no known sick contacts.  Pt has taken Delsym  OTC for symptoms. Pt has no other concerns at this time.    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 Authorizing Provider  albuterol  (VENTOLIN  HFA) 108 (90 Base) MCG/ACT inhaler Inhale 1-2 puffs into the lungs every 6 (six) hours as needed. 04/05/24  Yes Lashae Wollenberg, Jodi R, NP  azithromycin  (ZITHROMAX ) 250 MG tablet Take 1 tablet (250 mg total) by mouth daily. Take first 2 tablets together, then 1 every day until finished. 04/05/24  Yes Aliyah Abeyta, Jodi R, NP  benzonatate  (TESSALON ) 200 MG capsule Take 1 capsule (200 mg total) by mouth 3 (three) times daily as needed. 04/05/24  Yes Ardyn Forge, Jodi R, NP  Apoaequorin  (PREVAGEN PO) Take 1 capsule by mouth daily. Patient not taking: Reported on 11/20/2022    [provider]  carboxymethylcellulose (REFRESH PLUS) 0.5 % SOLN Place 1 drop into both eyes 3 (three) times daily as needed (dry eyes).  Patient not taking: Reported on 11/20/2022    [provider]  chlorhexidine  (PERIDEX ) 0.12 % solution by Mouth Rinse route. Swish with 1/2 a capful before bedtime and spit starting 24 hours after surgery Patient not taking: Reported on 11/20/2022 04/15/15   [provider]  diazepam  (VALIUM ) 5 MG tablet Take 1 tablet (5 mg total) by mouth every 8 (eight) hours as needed (dizziness). Patient not taking: Reported on 11/20/2022 05/04/15   Baxter Drivers, MD  docusate sodium  (COLACE) 100 MG capsule Take 1 capsule (100 mg total) by mouth 2 (two) times daily. Patient not taking: Reported on 11/20/2022 11/25/19   Patti Rosina SAUNDERS, PA-C  HYDROcodone -acetaminophen  (NORCO) 7.5-325 MG tablet Take 1-2 tablets by mouth every 4 (four) hours as needed for moderate pain. Patient not taking: Reported on 11/20/2022 11/25/19   Patti Rosina SAUNDERS, PA-C  losartan (COZAAR) 50 MG tablet Take 50 mg by mouth daily. 04/11/15   [provider]  meclizine  (ANTIVERT ) 25 MG tablet Take 1 tablet (25 mg total) by mouth 3 (three) times daily as needed for  dizziness. 05/04/15   Baxter Drivers, MD  methocarbamol  (ROBAXIN ) 500 MG tablet Take 1 tablet (500 mg total) by mouth every 6 (six) hours as needed for muscle spasms. Patient not taking: Reported on 11/20/2022 11/25/19   Patti Rosina SAUNDERS, PA-C  omega-3 acid ethyl esters (LOVAZA) 1 g capsule Take 1 g by mouth 2 (two) times daily.    [provider]  PARoxetine  (PAXIL ) 20 MG tablet Take 20 mg by mouth daily. 04/11/15   [provider]  polyethylene glycol (MIRALAX  / GLYCOLAX ) 17 g packet Take 17 g by mouth 2 (two) times daily. 11/25/19   Patti Rosina SAUNDERS, PA-C  Probiotic Product (ALIGN PO) Take 1 capsule by mouth  daily.    [provider]  simvastatin  (ZOCOR ) 10 MG tablet Take 10 mg by mouth daily. 04/11/15   [provider]    Family History History reviewed. No pertinent family history.  Social History Social History[1]   Allergies   Patient has no known allergies.   Review of Systems Review of Systems  HENT:  Positive for congestion.   Respiratory:  Positive for cough.      Physical Exam Triage Vital Signs ED Triage Vitals  Encounter Vitals Group     BP 04/05/24 1231 127/80     Girls Systolic BP Percentile --      Girls Diastolic BP Percentile --      Boys Systolic BP Percentile --      Boys Diastolic BP Percentile --      Pulse Rate 04/05/24 1231 (!) 115     Resp 04/05/24 1231 16     Temp 04/05/24 1231 98.5 F (36.9 C)     Temp Source 04/05/24 1231 Oral     SpO2 04/05/24 1231 91 %     Weight --      Height --      Head Circumference --      Peak Flow --      Pain Score 04/05/24 1230 0     Pain Loc --      Pain Education --      Exclude from Growth Chart --    No data found.  Updated Vital Signs BP 127/80 (BP Location: Right Arm)   Pulse (!) 115   Temp 98.5 F (36.9 C) (Oral)   Resp 16   SpO2 91%   Visual Acuity Right Eye Distance:   Left Eye Distance:   Bilateral Distance:    Right Eye Near:   Left Eye Near:    Bilateral Near:     Physical Exam Vitals and nursing note reviewed.  Constitutional:      General: He is not in acute distress.    Appearance: Normal appearance. He is not ill-appearing or toxic-appearing.  HENT:     Head: Normocephalic and atraumatic.     Right Ear: Tympanic membrane and ear canal normal.     Left Ear: Tympanic membrane and ear canal normal.     Nose: Congestion present.     Mouth/Throat:     Mouth: Mucous membranes are moist.     Pharynx: No oropharyngeal exudate or posterior oropharyngeal erythema.  Eyes:     Pupils: Pupils are equal, round, and reactive to light.  Cardiovascular:     Rate and  Rhythm: Normal rate and regular rhythm.     Heart sounds: Normal heart sounds.  Pulmonary:     Effort: Pulmonary effort is normal.     Breath sounds: Normal breath  sounds. No wheezing, rhonchi or rales.  Musculoskeletal:     Cervical back: Normal range of motion and neck supple.  Lymphadenopathy:     Cervical: No cervical adenopathy.  Skin:    General: Skin is warm and dry.  Neurological:     General: No focal deficit present.     Mental Status: He is alert and oriented to person, place, and time.  Psychiatric:        Mood and Affect: Mood normal.        Behavior: Behavior normal.      UC Treatments / Results  Labs (all labs ordered are listed, but only abnormal results are displayed) Labs Reviewed - No data to display tains abnormal data Comprehensive Metabolic Panel Order: [id number] Component Ref Range & Units 5 mo ago  Sodium 136 - 145 mmol/L 139  Potassium 3.5 - 5.1 mmol/L 4.1  Chloride 98 - 107 mmol/L 106  CO2 21 - 31 mmol/L 23  Anion Gap 6 - 14 mmol/L 10  Glucose, Random 70 - 99 mg/dL 892 High   Blood Urea Nitrogen (BUN) 7 - 25 mg/dL 15  Creatinine 9.29 - 8.69 mg/dL 8.99  eGFR >40 fO/fpw/8.26f7 78  Comment: GFR estimated by CKD-EPI equations(NKF 2021).  Recommend confirmation of Cr-based eGFR by using Cys-based eGFR and other filtration markers (if applicable) in complex cases and clinical decision-making, as needed.  Albumin 3.5 - 5.7 g/dL 4.3  Total Protein 6.4 - 8.9 g/dL 7.0  Bilirubin, Total 0.3 - 1.0 mg/dL 0.8  Alkaline Phosphatase (ALP) 34 - 104 U/L 47  Aspartate Aminotransferase (AST) 13 - 39 U/L 56 High   Alanine Aminotransferase (ALT) 7 - 52 U/L 57 High   Calcium 8.6 - 10.3 mg/dL 9.2  BUN/Creatinine Ratio   Comment: Creatinine is normal, ratio is not clinically indicated.  Resulting Agency [facility] [place](CLIA# [id number])   Specimen Collected: 11/06/23 08:45   Performed by: [facility](CLIA# [id number]) Last Resulted: 11/06/23 13:13  Received From: [facility]  Result Received: 04/05/24 12:21   EKG   Radiology No results found.  Procedures Procedures (including critical care time)  Medications Ordered in UC Medications  dexamethasone  (DECADRON ) injection 8 mg (has no administration in time range)    Initial Impression / Assessment and Plan / UC Course  I have reviewed the triage vital signs and the nursing notes.  Pertinent labs & imaging results that were available during my care of the patient were reviewed by me and considered in my medical decision making (see chart for details).  Clinical Course as of 04/05/24 1334  Sat Apr 05, 2024  1332 Heart rate recheck 99, O2 93% on room air [JM]    Clinical Course User Index [JM] Loreda Myla SAUNDERS, NP    Reviewed exam and symptoms with patient.  Wet read of x-ray without obvious consolidation, will contact for any positive results based on radiology overread once available.  Will start azithromycin , Tessalon  and albuterol  inhaler.  Patient requested a steroid injection as this has helped in the past, Decadron  given in clinic.  Encouraged rest fluids and PCP follow-up 2 to 3 days for recheck.  ER precautions reviewed. Final Clinical Impressions(s) / UC Diagnoses   Final diagnoses:  Acute cough  Acute bronchitis, unspecified organism     Discharge Instructions      Start azithromycin  antibiotic as prescribed.  You may take Tessalon  3 times a day as needed for your cough.  You are given a shot of a steroid in clinic With your cough as well.  May use albuterol  inhaler as needed for any wheezing or shortness of breath.  Lots of rest and fluids.  Follow-up with your PCP in 2 to 3 days for recheck.  Please go to the emergency room for any worsening symptoms.  Hope you feel better soon!     ED Prescriptions     Medication Sig Dispense Auth. Provider   benzonatate  (TESSALON ) 200 MG capsule Take 1  capsule (200 mg total) by mouth 3 (three) times daily as needed. 20 capsule Reyonna Haack, Jodi R, NP   azithromycin  (ZITHROMAX ) 250 MG tablet Take 1 tablet (250 mg total) by mouth daily. Take first 2 tablets together, then 1 every day until finished. 6 tablet Danise Dehne, Jodi R, NP   albuterol  (VENTOLIN  HFA) 108 (90 Base) MCG/ACT inhaler Inhale 1-2 puffs into the lungs every 6 (six) hours as needed. 1 each Loreda Myla SAUNDERS, NP      PDMP not reviewed this encounter.     [1]  Social History Tobacco Use   Smoking status: Former   Smokeless tobacco: Never  Vaping Use   Vaping status: Never Used  Substance Use Topics   Alcohol  use: Yes    Comment: social   Drug use: No     Loreda Myla SAUNDERS, NP 04/05/24 1334  "
